# Patient Record
Sex: Male | Born: 1950
Health system: Southern US, Community
[De-identification: ages and names within clinical notes are randomized; demographics above are authoritative.]

## PROBLEM LIST (undated history)

## (undated) DIAGNOSIS — I4819 Other persistent atrial fibrillation: Secondary | ICD-10-CM

## (undated) DIAGNOSIS — E7800 Pure hypercholesterolemia, unspecified: Secondary | ICD-10-CM

## (undated) DIAGNOSIS — I5032 Chronic diastolic (congestive) heart failure: Secondary | ICD-10-CM

## (undated) DIAGNOSIS — E663 Overweight: Secondary | ICD-10-CM

## (undated) DIAGNOSIS — Z720 Tobacco use: Secondary | ICD-10-CM

## (undated) DIAGNOSIS — C4491 Basal cell carcinoma of skin, unspecified: Secondary | ICD-10-CM

## (undated) DIAGNOSIS — I10 Essential (primary) hypertension: Secondary | ICD-10-CM

## (undated) DIAGNOSIS — Z9989 Dependence on other enabling machines and devices: Secondary | ICD-10-CM

## (undated) DIAGNOSIS — G4733 Obstructive sleep apnea (adult) (pediatric): Secondary | ICD-10-CM

## (undated) DIAGNOSIS — F419 Anxiety disorder, unspecified: Secondary | ICD-10-CM

## (undated) DIAGNOSIS — N200 Calculus of kidney: Secondary | ICD-10-CM

## (undated) DIAGNOSIS — G4700 Insomnia, unspecified: Secondary | ICD-10-CM

## (undated) HISTORY — DX: Basal cell carcinoma of skin, unspecified: C44.91

## (undated) HISTORY — DX: Anxiety disorder, unspecified: F41.9

## (undated) HISTORY — DX: Insomnia, unspecified: G47.00

## (undated) HISTORY — DX: Calculus of kidney: N20.0

## (undated) HISTORY — PX: APPENDECTOMY: SHX54

## (undated) HISTORY — DX: Overweight: E66.3

## (undated) HISTORY — DX: Essential (primary) hypertension: I10

## (undated) HISTORY — PX: TONSILLECTOMY: SUR1361

## (undated) HISTORY — DX: Dependence on other enabling machines and devices: Z99.89

## (undated) HISTORY — DX: Tobacco use: Z72.0

## (undated) HISTORY — DX: Obstructive sleep apnea (adult) (pediatric): G47.33

## (undated) HISTORY — DX: Chronic diastolic (congestive) heart failure: I50.32

## (undated) HISTORY — DX: Pure hypercholesterolemia, unspecified: E78.00

## (undated) HISTORY — DX: Other persistent atrial fibrillation: I48.19

---

## 2003-07-27 ENCOUNTER — Ambulatory Visit (HOSPITAL_COMMUNITY): Admission: RE | Admit: 2003-07-27 | Discharge: 2003-07-27 | Payer: Self-pay | Admitting: Gastroenterology

## 2003-07-27 ENCOUNTER — Encounter (INDEPENDENT_AMBULATORY_CARE_PROVIDER_SITE_OTHER): Payer: Self-pay

## 2010-06-15 ENCOUNTER — Inpatient Hospital Stay (HOSPITAL_COMMUNITY): Admission: EM | Admit: 2010-06-15 | Discharge: 2010-06-22 | Payer: Self-pay | Admitting: Emergency Medicine

## 2010-06-15 ENCOUNTER — Ambulatory Visit: Payer: Self-pay | Admitting: Cardiology

## 2010-06-17 ENCOUNTER — Ambulatory Visit: Payer: Self-pay | Admitting: Cardiology

## 2010-06-17 ENCOUNTER — Encounter: Payer: Self-pay | Admitting: Cardiology

## 2010-06-21 ENCOUNTER — Encounter: Payer: Self-pay | Admitting: Internal Medicine

## 2010-06-24 ENCOUNTER — Ambulatory Visit: Payer: Self-pay | Admitting: Cardiology

## 2010-06-24 LAB — CONVERTED CEMR LAB: POC INR: 4.5

## 2010-06-30 ENCOUNTER — Ambulatory Visit: Payer: Self-pay | Admitting: Cardiovascular Disease

## 2010-07-07 ENCOUNTER — Ambulatory Visit: Payer: Self-pay | Admitting: Cardiology

## 2010-07-07 ENCOUNTER — Encounter: Payer: Self-pay | Admitting: Cardiology

## 2010-07-07 LAB — CONVERTED CEMR LAB: POC INR: 2.8

## 2010-07-11 ENCOUNTER — Ambulatory Visit: Payer: Self-pay | Admitting: Cardiology

## 2010-07-11 DIAGNOSIS — I4891 Unspecified atrial fibrillation: Secondary | ICD-10-CM | POA: Insufficient documentation

## 2010-07-11 DIAGNOSIS — I10 Essential (primary) hypertension: Secondary | ICD-10-CM | POA: Insufficient documentation

## 2010-07-14 ENCOUNTER — Ambulatory Visit: Payer: Self-pay | Admitting: Cardiology

## 2010-07-14 DIAGNOSIS — R0609 Other forms of dyspnea: Secondary | ICD-10-CM

## 2010-07-14 DIAGNOSIS — R0989 Other specified symptoms and signs involving the circulatory and respiratory systems: Secondary | ICD-10-CM

## 2010-07-19 ENCOUNTER — Telehealth: Payer: Self-pay | Admitting: Cardiology

## 2010-07-21 LAB — CONVERTED CEMR LAB
Bilirubin, Direct: 0.1 mg/dL (ref 0.0–0.3)
HDL: 47.8 mg/dL (ref >39.00)
Total Bilirubin: 0.6 mg/dL (ref 0.3–1.2)
VLDL: 37.6 mg/dL (ref 0.0–40.0)

## 2010-09-19 ENCOUNTER — Telehealth: Payer: Self-pay | Admitting: Cardiology

## 2010-10-05 ENCOUNTER — Encounter: Payer: Self-pay | Admitting: Cardiovascular Disease

## 2010-10-11 ENCOUNTER — Ambulatory Visit
Admission: RE | Admit: 2010-10-11 | Discharge: 2010-10-11 | Payer: Self-pay | Source: Home / Self Care | Attending: Cardiology | Admitting: Cardiology

## 2010-10-11 ENCOUNTER — Other Ambulatory Visit: Payer: Self-pay | Admitting: Cardiology

## 2010-10-11 DIAGNOSIS — E7800 Pure hypercholesterolemia, unspecified: Secondary | ICD-10-CM | POA: Insufficient documentation

## 2010-10-11 LAB — HEPATIC FUNCTION PANEL
ALT: 33 U/L (ref 0–53)
AST: 24 U/L (ref 0–37)
Alkaline Phosphatase: 67 U/L (ref 39–117)
Bilirubin, Direct: 0.1 mg/dL (ref 0.0–0.3)
Total Bilirubin: 0.7 mg/dL (ref 0.3–1.2)

## 2010-10-11 LAB — LIPID PANEL
LDL Cholesterol: 118 mg/dL — ABNORMAL HIGH (ref 0–99)
Total CHOL/HDL Ratio: 4

## 2010-10-11 NOTE — Medication Information (Signed)
Summary: new ot coumadin  Anticoagulant Therapy  Managed by: Reina Fuse, PharmD Referring MD: Shirlee Latch MD, Freida Busman PCP: Dr. Nehemiah Settle Supervising MD: Riley Kill MD, Maisie Fus Indication 1: Atrial Fibrillation Lab Used: [facility] [place] Site: [address] INR POC 4.5 INR RANGE 2-3  Dietary changes: no    Health status changes: no    Bleeding/hemorrhagic complications: no    Recent/future hospitalizations: no    Any changes in medication regimen? yes       Details: Toprol XL, ampicillin x 5 days  Recent/future dental: no  Any missed doses?: no       Is patient compliant with meds? yes      Comments: Pt had episode of rapid afib while in hospital for appendectomy. Started on Coumadin and Toprol XL (pt unsure of dose, but will bring in at visit next week).  Allergies (verified): No Known Drug Allergies  Anticoagulation Management History:      The patient comes in today for his initial visit for anticoagulation therapy.  Negative risk factors for bleeding include an age less than 59 years old.  The bleeding index is 'low risk'.  Negative CHADS2 values include Age > 59 years old.  Anticoagulation responsible provider: Riley Kill MD, Maisie Fus.  INR POC: 4.5.  Cuvette Lot#: [id number].    Anticoagulation Management Assessment/Plan:      The next INR is due 06/30/2010.  Results were reviewed/authorized by Reina Fuse, PharmD.  He was notified by Reina Fuse PharmD.         Current Anticoagulation Instructions: INR 4.5  Do not take Coumadin on Saturday, October 15th and Sunday, October 16th. Then, take Coumadin 5 mg (1 tab) daily. Return to clinic 1 week.

## 2010-10-11 NOTE — Assessment & Plan Note (Signed)
Summary: [place] [facility]   Primary Provider:  Dr. Nehemiah Settle   History of Present Illness: 60 yo with history of HTN and paroxysmal atrial fibrillation presents to establish cardiology followup.  Patient had a ruptured appendix in 10/11.  Post-appendectomy, he developed atrial fibrillation.  This was somewhat difficult to control and did not spontaneously convert, so TEE-guided cardioversion was done. During atrial fibrillation with RVR episode, patient had mild diastolic CHF requring Lasix.  He has remained in NSR on coumadin and Toprol XL.  He has noticed that his systolic BP has been running in the 140s (was better-controlled when he was on Diovan in the past).  He is quite active and has been walking briskly twice daily for exercise.  He used to bike but has cut back on this now that he is on coumadin.  He has cut himself a number of times working in his woodshop and has had some episodes of fairly profuse superficial bleeding.  He is still off work (Naval architect).  Currnetly, no exertional dyspnea or exertional chest pain. His work does require a lot of heavy lifting and loading/unloading and is likely puts him at higher risk of an accident causing bleeding from coumadin. Patient quit cigarettes after leaving the hospital.  He has cut back to 1-2 beers/week (prior 2-3 every other day).  He has OSA and has been using his CPAP as instructed.   ECG: NSR, Q in lead III  Labs (10/11): K 4.0, creatinine 1.03, HCT 42.7, BNP 325=>183 (in hospital)   Current Medications (verified): 1)  Toprol Xl 50 Mg Xr24h-Tab (Metoprolol Succinate) .... Take One Tablet By Mouth Once Daily. 2)  Coumadin 5 Mg Tabs (Warfarin Sodium) .... Use As Directed By Anticoagulation Clinic.  Allergies (verified): No Known Drug Allergies  Past History:  Past Medical History: 1. hypertension 2. acute appendicitis s/p appendectomy 10/11 (had rupture) 3. Atrial fibrillation: Post-op appendectdomy in 10/11, had TEE-guided DCCV 4.  Diastolic CHF in the setting of atrial fibrillation with RVR.  Echo (10/11): EF 60-65%, mild LVH, mild to moderate LAE.  5. OSA on CPAP  Family History: Mother with MI in her 93s, father with PCI at 34, father with AAA  Social History: The patient is married.   He is employed as a Naval architect.   He has an occasional cigar here and there.  He quit smoking cigarettes in 10/11 (1 pack/week prior) 2-3 beers/week now, used to drink 2-3 beers every other day.   Review of Systems       All systems reviewed and negative except as per HPI.   Vital Signs:  Patient profile:   60 year old male Height:      75 inches Weight:      236 pounds BMI:     29.60 Pulse rate:   85 / minute Resp:     16 per minute BP sitting:   144 / 90  (left arm)  Vitals Entered By: Marrion Coy, CNA (July 11, 2010 11:59 AM)  Physical Exam  General:  Well developed, well nourished, in no acute distress. Head:  normocephalic and atraumatic Nose:  no deformity, discharge, inflammation, or lesions Mouth:  Teeth, gums and palate normal. Oral mucosa normal. Neck:  Neck supple, no JVD. No masses, thyromegaly or abnormal cervical nodes. Lungs:  Clear bilaterally to auscultation and percussion. Heart:  Non-displaced PMI, chest non-tender; regular rate and rhythm, S1, S2 without murmurs, rubs or gallops. Carotid upstroke normal, no bruit.  Pedals normal pulses. No edema,  no varicosities. Abdomen:  Bowel sounds positive; abdomen soft and non-tender without masses, organomegaly, or hernias noted. No hepatosplenomegaly. Msk:  Back normal, normal gait. Muscle strength and tone normal. Extremities:  No clubbing or cyanosis. Neurologic:  Alert and oriented x 3. Skin:  Intact without lesions or rashes. Psych:  Normal affect.   Impression & Recommendations:  Problem # 1:  ATRIAL FIBRILLATION (ICD-427.31) Paroxysmal atrial fibrillation post-op appendectomy.  This required TEE-guided cardioversion.  Patient remains in  NSR.  CHADSVASC and CHADS2 score are both 1 for HTN (would not count diastolic CHF as he had CHF only when in atrial fibrillation with RVR after receiving IV fluids for surgery).  He is at high risk for bleeding from his occupation and hobbies.  I will have him continue coumadin for 6 weeks post cardioversion, then he will stop coumadin and start ASA 325 mg daily.  I am going to decrease Toprol XL to 25 mg daily as I am starting him back on Diovan, and he feels the Toprol XL has made him fatigued.  He may go back to work 07/25/10 (I will send a copy of this note to [facility].   Problem # 2:  HYPERTENSION, UNSPECIFIED (ICD-401.9) BP running high.  He was not sent home from the hospital on his Diovan.  I will have him start back on this at 160 mg daily, which was his prior dose.  He needs to continue his CPAP.   I will check lipids/LFTs.   Patient Instructions: 1)  Continue Coumadin until 08/02/10--start Aspirin 325mg  daily on 08/03/10. Aspirin should be buffered or coated. 2)  Take Diovan 160mg  daily. 3)  Decrease Toprol XL to 25mg  daily--this will be one-half of a 50mg  tablet daily. 4)  Your physician recommends that you return for a FASTING lipid profile/liver profile-786.09  401.9 -you can have this on Thursday when you have your Coumadin checked. 5)  Your physician recommends that you schedule a follow-up appointment in: 3 months with Dr Shirlee Latch.  Appended Document: [place] [facility] faxed Department of Transportation Medical Examination Request for Additional Information to [facility]  [phone]

## 2010-10-11 NOTE — Medication Information (Signed)
Summary: Coumadin Clinic  Anticoagulant Therapy  Managed by: Reina Fuse, PharmD Referring MD: Shirlee Latch MD, Freida Busman PCP: Dr. Rebeca Allegra MD: Jens Som MD, Arlys John Indication 1: Atrial Fibrillation Lab Used: [facility] [place] Site: [address] INR POC 2.8 INR RANGE 2-3  Dietary changes: no    Health status changes: no    Bleeding/hemorrhagic complications: no    Recent/future hospitalizations: no    Any changes in medication regimen? no    Recent/future dental: no  Any missed doses?: no       Is patient compliant with meds? yes       Allergies: No Known Drug Allergies  Anticoagulation Management History:      The patient is taking warfarin and comes in today for a routine follow up visit.  Negative risk factors for bleeding include an age less than 59 years old.  The bleeding index is 'low risk'.  Negative CHADS2 values include Age > 59 years old.  Anticoagulation responsible Betha Shadix: Jens Som MD, Arlys John.  INR POC: 2.8.  Cuvette Lot#: [id number].    Anticoagulation Management Assessment/Plan:      The patient's current anticoagulation dose is Coumadin 5 mg tabs: Use as directed by anticoagulation clinic..  The next INR is due 07/14/2010.  Results were reviewed/authorized by Reina Fuse, PharmD.  He was notified by Reina Fuse PharmD.         Prior Anticoagulation Instructions: INR 3.0  Take Coumadin 5 mg (1 tab) on Sun, Tues, Wed, Thur, Sat and Coumadin 2.5 mg (0.5 tab) on Mondays and Fridays. Return to clinic in 1 week.   Current Anticoagulation Instructions: INR 2.8  Continue taking Coumadin 1 tab (5 mg) on all days except Coumadin 0.5 tab (2.5 mg) on Mondays and Fridays.  Return to clinic 1 week.

## 2010-10-11 NOTE — Medication Information (Signed)
Summary: rov/sl  Anticoagulant Therapy  Managed by: Reina Fuse, PharmD Referring MD: Shirlee Latch MD, Freida Busman PCP: Dr. Rebeca Allegra MD: Eden Emms MD, Theron Arista Indication 1: Atrial Fibrillation Lab Used: [facility] [place] Site: [address] INR POC 3.0 INR RANGE 2-3  Dietary changes: no    Health status changes: no    Bleeding/hemorrhagic complications: no    Recent/future hospitalizations: no    Any changes in medication regimen? no    Recent/future dental: no  Any missed doses?: no       Is patient compliant with meds? yes       Allergies (verified): No Known Drug Allergies  Anticoagulation Management History:      The patient is taking warfarin and comes in today for a routine follow up visit.  Negative risk factors for bleeding include an age less than 98 years old.  The bleeding index is 'low risk'.  Negative CHADS2 values include Age > 14 years old.  Anticoagulation responsible Gedalia Mcmillon: Eden Emms MD, Theron Arista.  INR POC: 3.0.  Cuvette Lot#: [id number].    Anticoagulation Management Assessment/Plan:      The next INR is due 07/07/2010.  Results were reviewed/authorized by Reina Fuse, PharmD.  He was notified by Reina Fuse PharmD.         Prior Anticoagulation Instructions: INR 4.5  Do not take Coumadin on Saturday, October 15th and Sunday, October 16th. Then, take Coumadin 5 mg (1 tab) daily. Return to clinic 1 week.   Current Anticoagulation Instructions: INR 3.0  Take Coumadin 5 mg (1 tab) on Sun, Tues, Wed, Thur, Sat and Coumadin 2.5 mg (0.5 tab) on Mondays and Fridays. Return to clinic in 1 week.

## 2010-10-11 NOTE — Progress Notes (Signed)
Summary: Test results [facility]- LVM***pt rtn call***  Phone Note Call from Patient Call back at [phone]   Caller: Patient Reason for Call: Lab or Test Results Initial call taken by: Judie Grieve,  July 19, 2010 10:13 AM  Follow-up for Phone Call        left message to call back Dossie Arbour, RN, BSN  July 19, 2010 10:25 AM  Pt returning call for test results Judie Grieve  July 19, 2010 10:45 AM left message to call back Dossie Arbour, RN, BSN  July 19, 2010 10:55 AM11/9/11  07/20/10 0816 [email or web address] Claris Gladden, RN, BSN  LVMTCB. Whitney Maeola Sarah RN  July 20, 2010 11:00 AM  pt. aware. I will send his prescription in. We went over dietary changes as well. I have advised him to not eat/drink on his f/u with Dr. Shirlee Latch on 10/11/10 so they could recheck his lipids and liver function. Whitney Maeola Sarah RN  July 20, 2010 11:48 AM   Follow-up by: Whitney Maeola Sarah RN,  July 20, 2010 11:00 AM  Additional Follow-up for Phone Call Additional follow up Details #1::        pt rtn your call he uses [facility] Omer Jack  July 20, 2010 11:46 AM     New/Updated Medications: SIMVASTATIN 40 MG TABS (SIMVASTATIN) Take one tablet by mouth daily at bedtime Prescriptions: SIMVASTATIN 40 MG TABS (SIMVASTATIN) Take one tablet by mouth daily at bedtime  #30 x 6   Entered by:   Ellender Hose RN   Authorized by:   Marca Ancona, MD   Signed by:   Ellender Hose RN on 07/20/2010   Method used:   Electronically to        [facility]* (retail)       [address]       [place], [place]  [id number]       Ph: [phone]       Fax: [phone]   RxID:   [id number]   Appended Document: Test results [facility])- LVM***pt rtn call*** pt given results--see phone note 07/20/10

## 2010-10-11 NOTE — Medication Information (Signed)
Summary: rov/sl  Anticoagulant Therapy  Managed by: Reina Fuse, PharmD Referring MD: Shirlee Latch MD, Freida Busman PCP: Dr. Nehemiah Settle Supervising MD: Patty Sermons Indication 1: Atrial Fibrillation Lab Used: [facility] [place] Site: [address] INR POC 2.4 INR RANGE 2-3  Dietary changes: no    Health status changes: no    Bleeding/hemorrhagic complications: no    Recent/future hospitalizations: no    Any changes in medication regimen? yes       Details: Diovan started and metoprolol dose was halved  Recent/future dental: no  Any missed doses?: no       Is patient compliant with meds? yes       Allergies: No Known Drug Allergies  Anticoagulation Management History:      The patient is taking warfarin and comes in today for a routine follow up visit.  Negative risk factors for bleeding include an age less than 62 years old.  The bleeding index is 'low risk'.  Positive CHADS2 values include History of HTN.  Negative CHADS2 values include Age > 33 years old.  Anticoagulation responsible provider: [place].  INR POC: 2.4.  Cuvette Lot#: [id number].  Exp: 08/2011.    Anticoagulation Management Assessment/Plan:      The patient's current anticoagulation dose is Coumadin 5 mg tabs: Use as directed by anticoagulation clinic..  The next INR is due 07/14/2010.  Results were reviewed/authorized by Reina Fuse, PharmD.  He was notified by Hoy Register, PharmD Candidate.         Prior Anticoagulation Instructions: INR 2.8  Continue taking Coumadin 1 tab (5 mg) on all days except Coumadin 0.5 tab (2.5 mg) on Mondays and Fridays.  Return to clinic 1 week.   Current Anticoagulation Instructions: INR 2.4  Continue dose as before of 1 tablet daily except 1/2 tablet on Monday and Friday.  Last dose of Coumadin is 11/22 then start Aspirin per doctor's instructions on 11/23. Prescriptions: COUMADIN 5 MG TABS (WARFARIN SODIUM) Use as directed by anticoagulation clinic.  #15 x 0   Entered  by:   Weston Brass PharmD   Authorized by:   Marca Ancona, MD   Signed by:   Weston Brass PharmD on 07/14/2010   Method used:   Electronically to        [facility]* (retail)       [address]       [place], [place]  [id number]       Ph: [phone]       Fax: [phone]   RxID:   [id number]

## 2010-10-12 ENCOUNTER — Encounter: Payer: Self-pay | Admitting: Cardiology

## 2010-10-13 NOTE — Progress Notes (Signed)
Summary: SIMVASTATIN, TOPROL  Phone Note Refill Request Message from:  Patient on September 19, 2010 9:12 AM  Refills Requested: Medication #1:  SIMVASTATIN 40 MG TABS Take one tablet by mouth daily at bedtime.  Medication #2:  TOPROL XL 50 MG XR24H-TAB Take one-half  tablet by mouth once daily. [facility] [phone]  Initial call taken by: Judie Grieve,  September 19, 2010 9:13 AM    Prescriptions: SIMVASTATIN 40 MG TABS (SIMVASTATIN) Take one tablet by mouth daily at bedtime  #90 x 3   Entered by:   Caralee Ates CMA   Authorized by:   Marca Ancona, MD   Signed by:   Caralee Ates CMA on 09/19/2010   Method used:   Faxed to ...       [facility] (mail-order)             , [place]         Ph:        Fax: [phone]   RxID:   [id number] TOPROL XL 50 MG XR24H-TAB (METOPROLOL SUCCINATE) Take one-half  tablet by mouth once daily.  #45 x 3   Entered by:   Caralee Ates CMA   Authorized by:   Marca Ancona, MD   Signed by:   Caralee Ates CMA on 09/19/2010   Method used:   Faxed to ...       [facility] (mail-order)             , [place]         Ph:        Fax: [phone]   RxID:   [id number]

## 2010-10-13 NOTE — Medication Information (Signed)
Summary: Coumadin Clinic  Anticoagulant Therapy  Managed by: Inactive Referring MD: Shirlee Latch MD, Freida Busman PCP: Dr. Rebeca Allegra MD: Patty Sermons Indication 1: Atrial Fibrillation Lab Used: [facility] [place] Site: [address] INR RANGE 2-3          Comments: Changed to ASA 325 per Dr. Shirlee Latch  Allergies: No Known Drug Allergies  Anticoagulation Management History:      Negative risk factors for bleeding include an age less than 59 years old.  The bleeding index is 'low risk'.  Positive CHADS2 values include History of HTN.  Negative CHADS2 values include Age > 59 years old.  Anticoagulation responsible provider: [place].  Exp: 08/2011.    Anticoagulation Management Assessment/Plan:      The patient's current anticoagulation dose is Coumadin 5 mg tabs: Use as directed by anticoagulation clinic..  The next INR is due 07/14/2010.  Results were reviewed/authorized by Inactive.         Prior Anticoagulation Instructions: INR 2.4  Continue dose as before of 1 tablet daily except 1/2 tablet on Monday and Friday.  Last dose of Coumadin is 11/22 then start Aspirin per doctor's instructions on 11/23.

## 2010-10-19 NOTE — Assessment & Plan Note (Signed)
Summary: [facility]   Primary Provider:  Dr. Nehemiah Settle   History of Present Illness: 60 yo with history of HTN and paroxysmal atrial fibrillation presents for followup.  Patient had a ruptured appendix in 10/11.  Post-appendectomy, he developed atrial fibrillation.  This was somewhat difficult to control and did not spontaneously convert, so TEE-guided cardioversion was done. He was on coumadin for about 6 wks after cardioversion.  We then stopped it and put him on ASA as his CHADSVASC score was only 1.  Patient has been doing well recently.  No tachypalpitations.  He is in sinus rhythm today.  No chest pain or exertional dyspnea.  He walks 1.5 miles about [place] times a week for exercise with no problems.  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   LDL was quite high in 11/11, so he is now taking simvastatin.   Labs (10/11): K 4.0, creatinine 1.03, HCT 42.7, BNP 325=>183 (in hospital) Labs (11/11): LDL 191, HDL 48  CHF History:      Other comments include: no complaints.     Current Medications (verified): 1)  Toprol Xl 50 Mg Xr24h-Tab (Metoprolol Succinate) .... Take One-Half  Tablet By Mouth Once Daily. 2)  Simvastatin 40 Mg Tabs (Simvastatin) .... Take One Tablet By Mouth Daily At Bedtime [place])  Ecotrin 325 Mg Tbec (Aspirin) 4)  Losartan Potassium 50 Mg Tabs (Losartan Potassium) .... Once Daily 5)  Multivitamins  Tabs (Multiple Vitamin) .... Daily 6)  Co-Enzyme Q-10 100 Mg Caps (Coenzyme Q10) .... Daily  Allergies (verified): No Known Drug Allergies  Past History:  Past Medical History: 1. hypertension 2. acute appendicitis s/p appendectomy 10/11 (had rupture) [place]. Atrial fibrillation: Post-op appendectdomy in 10/11, had TEE-guided DCCV 4. Diastolic CHF in the setting of atrial fibrillation with RVR.  Echo (10/11): EF 60-65%, mild LVH, mild to moderate LAE.  5. OSA on CPAP 6. Hyperlipidemia  Family History: Reviewed history from 07/11/2010 and no changes required. Mother  with MI in her 62s, father with PCI at 4, father with AAA  Social History: Reviewed history from 07/11/2010 and no changes required. The patient is married.   He is employed as a Naval architect.   He has an occasional cigar here and there.  He quit smoking cigarettes in 10/11 (1 pack/week prior) 2-[place] beers/week now, used to drink 2-[place] beers every other day.   Vital Signs:  Patient profile:   60 year old male Height:      75 inches Weight:      239 pounds Pulse rate:   80 / minute Pulse rhythm:   regular BP sitting:   116 / 82  (left arm)  Physical Exam  General:  Well developed, well nourished, in no acute distress. Neck:  Neck supple, no JVD. No masses, thyromegaly or abnormal cervical nodes. Lungs:  Clear bilaterally to auscultation and percussion. Heart:  Non-displaced PMI, chest non-tender; regular rate and rhythm, S1, S2 without murmurs, rubs or gallops. Carotid upstroke normal, no bruit.  Pedals normal pulses. No edema, no varicosities. Abdomen:  Bowel sounds positive; abdomen soft and non-tender without masses, organomegaly, or hernias noted. No hepatosplenomegaly. Extremities:  No clubbing or cyanosis. Neurologic:  Alert and oriented x [place]. Psych:  Normal affect.   Impression & Recommendations:  Problem # 1:  ATRIAL FIBRILLATION (ICD-427.31) No further symptomatic atrial fibrillation.  He is in NSR today.  Suspect atrial fib was triggered by the stress of surgery (though he also has history of HTN and OSA).  CHADSVASC score = 1.  Continue Toprol XL and ASA 325.  If has recurrence, could consider dronedarone use.   Problem # 2:  HYPERCHOLESTEROLEMIA (ICD-272.0) Check lipids/LFTs today on simvastatin.   Problem # [place]:  HYPERTENSION, UNSPECIFIED (ICD-401.9) BP under good control on current regimen.   Other Orders: TLB-Hepatic/Liver Function Pnl (80076-HEPATIC) [facility]-Lipid Panel (80061-LIPID)  CHF Assessment/Plan:      The patient's current weight is 239 pounds.  His previous  weight was 236 pounds.    Patient Instructions: 1)  Your physician recommends that you schedule a follow-up appointment in: 6 months with Dr Shirlee Latch 2)  Your physician recommends that you return for a FASTING lipid and liver profile: today [place])  Your physician recommends that you continue on your current medications as directed. Please refer to the Current Medication list given to you today.    Current Allergies (reviewed today): No known allergies

## 2010-10-19 NOTE — Letter (Signed)
Summary: Custom - Lipid  [facility], Main Office  [address]   [place], [place] [address]   Phone: [phone]  Fax: [phone]     October 12, 2010 MRN: [id number]   [place] [address] [place], [place]  [address]   Dear [name],  Dr Shirlee Latch has  reviewed your cholesterol results.  They are as follows:     Total Cholesterol:    185 (Desirable: less than 200)       HDL  Cholesterol:     50.30  (Desirable: greater than 40 for men and 50 for women)       LDL Cholesterol:       118  (Desirable: less than 100 for low risk and less than 70 for moderate to high risk)       Triglycerides:       82.0  (Desirable: less than 150)  His  recommendations include: more exercise to help achieve an LDL cholesterol under 100.   Call our office at the number listed above if you have any questions.  Lowering your LDL cholesterol is important, but it is only one of a large number of "risk factors" that may indicate that you are at risk for heart disease, stroke or other complications of hardening of the arteries.  Other risk factors include:   A.  Cigarette Smoking* B.  High Blood Pressure* C.  Obesity* D.   Low HDL Cholesterol (see yours above)* E.   Diabetes Mellitus (higher risk if your is uncontrolled) F.  Family history of premature heart disease G.  Previous history of stroke or cardiovascular disease    *These are risk factors YOU HAVE CONTROL OVER.  For more information, visit .  There is now evidence that lowering the TOTAL CHOLESTEROL AND LDL CHOLESTEROL can reduce the risk of heart disease.  The American Heart Association recommends the following guidelines for the treatment of elevated cholesterol:  1.  If there is now current heart disease and less than two risk factors, TOTAL CHOLESTEROL should be less than 200 and LDL CHOLESTEROL should be less than 100. 2.  If there is current heart disease or two or more risk factors, TOTAL CHOLESTEROL should  be less than 200 and LDL CHOLESTEROL should be less than 70.  A diet low in cholesterol, saturated fat, and calories is the cornerstone of treatment for elevated cholesterol.  Cessation of smoking and exercise are also important in the management of elevated cholesterol and preventing vascular disease.  Studies have shown that 30 to 60 minutes of physical activity most days can help lower blood pressure, lower cholesterol, and keep your weight at a healthy level.  Drug therapy is used when cholesterol levels do not respond to therapeutic lifestyle changes (smoking cessation, diet, and exercise) and remains unacceptably high.  If medication is started, it is important to have you levels checked periodically to evaluate the need for further treatment options.  Thank you,    Luana Shu

## 2010-11-24 LAB — CBC
HCT: 40.6 % (ref 39.0–52.0)
HCT: 41.4 % (ref 39.0–52.0)
HCT: 41.4 % (ref 39.0–52.0)
HCT: 42.7 % (ref 39.0–52.0)
HCT: 43.5 % (ref 39.0–52.0)
HCT: 43.6 % (ref 39.0–52.0)
Hemoglobin: 14.6 g/dL (ref 13.0–17.0)
Hemoglobin: 14.7 g/dL (ref 13.0–17.0)
Hemoglobin: 17.1 g/dL — ABNORMAL HIGH (ref 13.0–17.0)
MCH: 30.8 pg (ref 26.0–34.0)
MCH: 31.2 pg (ref 26.0–34.0)
MCH: 31.4 pg (ref 26.0–34.0)
MCHC: 34.5 g/dL (ref 30.0–36.0)
MCV: 88.2 fL (ref 78.0–100.0)
MCV: 89.8 fL (ref 78.0–100.0)
MCV: 91.2 fL (ref 78.0–100.0)
Platelets: 124 10*3/uL — ABNORMAL LOW (ref 150–400)
Platelets: 125 10*3/uL — ABNORMAL LOW (ref 150–400)
RBC: 4.61 MIL/uL (ref 4.22–5.81)
RBC: 4.71 MIL/uL (ref 4.22–5.81)
RBC: 4.93 MIL/uL (ref 4.22–5.81)
RBC: 5.26 MIL/uL (ref 4.22–5.81)
RDW: 13 % (ref 11.5–15.5)
RDW: 13.2 % (ref 11.5–15.5)
RDW: 13.2 % (ref 11.5–15.5)
RDW: 13.3 % (ref 11.5–15.5)
RDW: 13.4 % (ref 11.5–15.5)
WBC: 14.2 10*3/uL — ABNORMAL HIGH (ref 4.0–10.5)
WBC: 7.2 10*3/uL (ref 4.0–10.5)
WBC: 8.4 10*3/uL (ref 4.0–10.5)
WBC: 9.2 10*3/uL (ref 4.0–10.5)
WBC: 9.3 10*3/uL (ref 4.0–10.5)
WBC: 9.9 10*3/uL (ref 4.0–10.5)

## 2010-11-24 LAB — URINALYSIS, ROUTINE W REFLEX MICROSCOPIC
Bilirubin Urine: NEGATIVE
Glucose, UA: NEGATIVE mg/dL
Specific Gravity, Urine: 1.017 (ref 1.005–1.030)
Urobilinogen, UA: 0.2 mg/dL (ref 0.0–1.0)

## 2010-11-24 LAB — COMPREHENSIVE METABOLIC PANEL
ALT: 21 U/L (ref 0–53)
AST: 23 U/L (ref 0–37)
Albumin: 3.5 g/dL (ref 3.5–5.2)
CO2: 21 mEq/L (ref 19–32)
Calcium: 8.6 mg/dL (ref 8.4–10.5)
Chloride: 106 mEq/L (ref 96–112)
GFR calc Af Amer: >60 mL/min (ref >60)
GFR calc non Af Amer: >60 mL/min (ref >60)
Sodium: 136 mEq/L (ref 135–145)
Total Bilirubin: 1.8 mg/dL — ABNORMAL HIGH (ref 0.3–1.2)

## 2010-11-24 LAB — MAGNESIUM: Magnesium: 2.1 mg/dL (ref 1.5–2.5)

## 2010-11-24 LAB — BASIC METABOLIC PANEL
BUN: 11 mg/dL (ref 6–23)
BUN: 12 mg/dL (ref 6–23)
BUN: 8 mg/dL (ref 6–23)
CO2: 27 mEq/L (ref 19–32)
CO2: 30 mEq/L (ref 19–32)
CO2: 31 mEq/L (ref 19–32)
Calcium: 8.2 mg/dL — ABNORMAL LOW (ref 8.4–10.5)
Chloride: 106 mEq/L (ref 96–112)
Chloride: 107 mEq/L (ref 96–112)
Chloride: 95 mEq/L — ABNORMAL LOW (ref 96–112)
Chloride: 99 mEq/L (ref 96–112)
Creatinine, Ser: 1.05 mg/dL (ref 0.4–1.5)
Creatinine, Ser: 1.25 mg/dL (ref 0.4–1.5)
GFR calc Af Amer: >60 mL/min (ref >60)
GFR calc Af Amer: >60 mL/min (ref >60)
GFR calc non Af Amer: >60 mL/min (ref >60)
GFR calc non Af Amer: >60 mL/min (ref >60)
GFR calc non Af Amer: >60 mL/min (ref >60)
Glucose, Bld: 111 mg/dL — ABNORMAL HIGH (ref 70–99)
Potassium: 3.5 mEq/L (ref 3.5–5.1)
Potassium: 3.6 mEq/L (ref 3.5–5.1)
Potassium: 4 mEq/L (ref 3.5–5.1)
Potassium: 4 mEq/L (ref 3.5–5.1)
Potassium: 4.2 mEq/L (ref 3.5–5.1)
Potassium: 4.5 mEq/L (ref 3.5–5.1)
Sodium: 132 mEq/L — ABNORMAL LOW (ref 135–145)
Sodium: 139 mEq/L (ref 135–145)
Sodium: 139 mEq/L (ref 135–145)

## 2010-11-24 LAB — LIPASE, BLOOD: Lipase: 26 U/L (ref 11–59)

## 2010-11-24 LAB — PROTIME-INR
INR: 1.03 (ref 0.00–1.49)
INR: 1.36 (ref 0.00–1.49)
INR: 2.01 — ABNORMAL HIGH (ref 0.00–1.49)
Prothrombin Time: 17 seconds — ABNORMAL HIGH (ref 11.6–15.2)
Prothrombin Time: 22.9 seconds — ABNORMAL HIGH (ref 11.6–15.2)

## 2010-11-24 LAB — URINE MICROSCOPIC-ADD ON

## 2010-11-24 LAB — DIFFERENTIAL
Eosinophils Absolute: 0 10*3/uL (ref 0.0–0.7)
Eosinophils Relative: 0 % (ref 0–5)
Lymphs Abs: 2.6 10*3/uL (ref 0.7–4.0)
Monocytes Absolute: 0.1 10*3/uL (ref 0.1–1.0)

## 2010-11-24 LAB — BRAIN NATRIURETIC PEPTIDE: Pro B Natriuretic peptide (BNP): 183 pg/mL — ABNORMAL HIGH (ref 0.0–100.0)

## 2010-11-24 LAB — CARDIAC PANEL(CRET KIN+CKTOT+MB+TROPI)
CK, MB: 1.3 ng/mL (ref 0.3–4.0)
Relative Index: 0.8 (ref 0.0–2.5)
Troponin I: 0.05 ng/mL (ref 0.00–0.06)

## 2010-11-24 LAB — MRSA PCR SCREENING: MRSA by PCR: NEGATIVE

## 2010-11-24 LAB — HEPARIN LEVEL (UNFRACTIONATED): Heparin Unfractionated: 0.18 IU/mL — ABNORMAL LOW (ref 0.30–0.70)

## 2011-01-27 NOTE — Op Note (Signed)
   NAME:  [place] Park, [place] Park                           ACCOUNT NO.:  [id number]   MEDICAL RECORD NO.:  [id number]                   PATIENT TYPE:  AMB   LOCATION:  ENDO                                 FACILITY:  [facility]   PHYSICIAN:  Danise Edge, M.D.                DATE OF BIRTH:  01/24/1951   DATE OF PROCEDURE:  07/27/2003  DATE OF DISCHARGE:                                 OPERATIVE REPORT   PROCEDURE:  Screening colonoscopy.   INDICATIONS FOR PROCEDURE:  Mr. [name] is a 60 year old male born  1951/06/22.  Mr. [name] is scheduled to undergo his first screening  colonoscopy with polypectomy to prevent colon cancer.   ENDOSCOPIST:  Charolett Bumpers, M.D.   PREMEDICATION:  Versed 10 mg, Demerol 50 mg .   DESCRIPTION OF PROCEDURE:  After obtaining informed consent, Mr. [name] was  placed in the left lateral decubitus position. I administered intravenous  Demerol and intravenous Versed to achieve conscious sedation for the  procedure. The patient's blood pressure, oxygen saturation and cardiac  rhythm were monitored throughout the procedure and documented in the medical  record.   Anal inspection was normal. Digital rectal exam was normal.  The prostate  was nonnodular. The Olympus adjustable pediatric colonoscope was introduced  into the rectum and easily advanced to the cecum. Colonic preparation for  the exam today was excellent.   RECTUM:  Normal.   SIGMOID COLON AND DESCENDING COLON:  Normal.   SPLENIC FLEXURE:  Normal.   TRANSVERSE COLON:  Normal.   HEPATIC FLEXURE:  Normal.   ASCENDING COLON:  From the proximal ascending colon, a 1 mm sessile polyp  was removed with the cold biopsy forceps.   CECUM AND ILEOCECAL VALVE:  Normal.   ASSESSMENT:  A diminutive polyp was removed from the proximal ascending  colon with the cold biopsy forceps;  otherwise normal screening  proctocolonoscopy to the cecum.    RECOMMENDATIONS:  If proximal ascending colon polyp  returns neoplastic  pathologically, Mr. [name] should undergo a repeat colonoscopy in five years.                                               Danise Edge, M.D.    MJ/MEDQ  D:  07/27/2003  T:  07/27/2003  Job:  [id number]

## 2011-05-12 ENCOUNTER — Other Ambulatory Visit: Payer: Self-pay | Admitting: Dermatology

## 2011-06-27 ENCOUNTER — Telehealth: Payer: Self-pay | Admitting: Cardiology

## 2011-06-27 ENCOUNTER — Other Ambulatory Visit: Payer: Self-pay

## 2011-06-27 MED ORDER — SIMVASTATIN 40 MG PO TABS: 40.0000 mg | ORAL_TABLET | Freq: Every evening | ORAL | Status: DC

## 2011-06-27 MED ORDER — METOPROLOL SUCCINATE ER 50 MG PO TB24: 25.0000 mg | ORAL_TABLET | Freq: Every day | ORAL | Status: DC

## 2011-06-27 NOTE — Telephone Encounter (Signed)
Walk In Pt Form" Pt needs Prescriptions Faxed in" sent to [facility] 06/27/11/km

## 2011-07-06 ENCOUNTER — Other Ambulatory Visit: Payer: Self-pay | Admitting: Dermatology

## 2011-12-30 IMAGING — CR DG CHEST 1V PORT
1 series · 1 of 1 positions shown · non-contrast
Comparison: CT abdomen 06/15/2010.

CLINICAL DATA: 59-year-old male with appendicitis.

PORTABLE CHEST - 1 VIEW

[view not recorded]
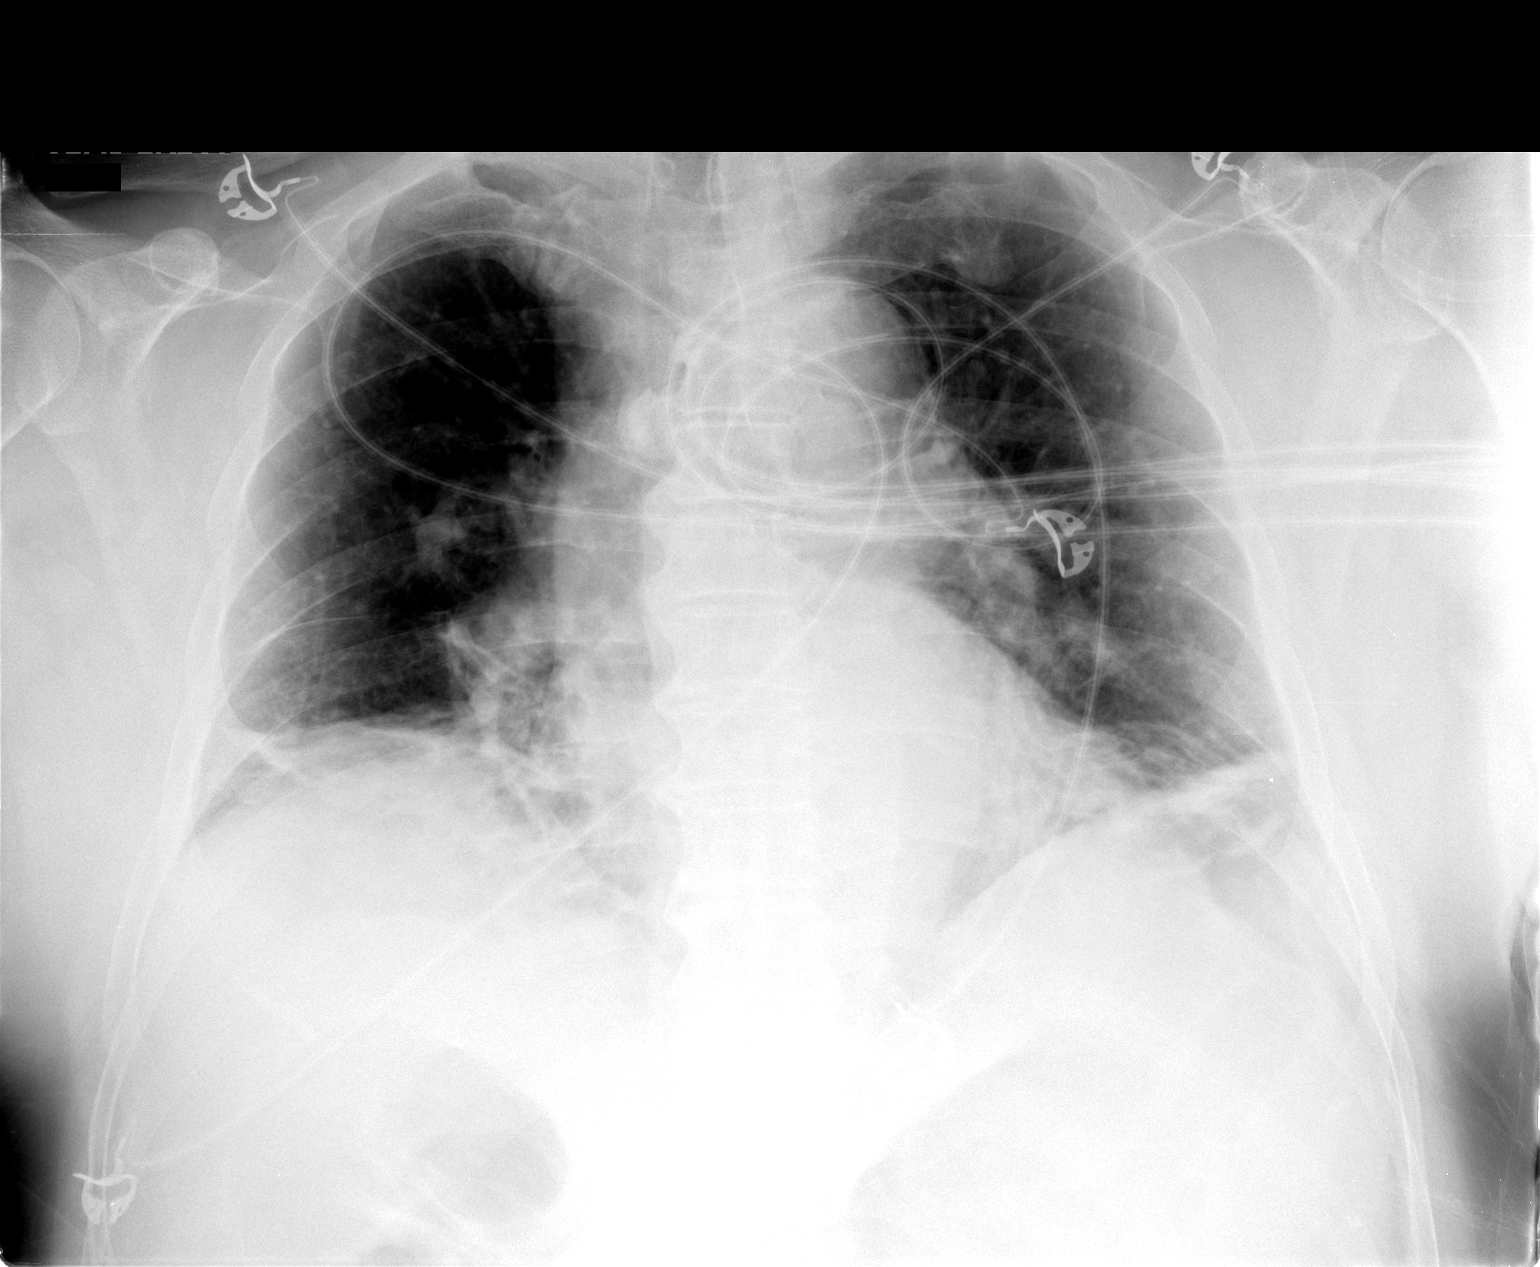

[1 of 1 positions shown; findings below may reference images not displayed]

FINDINGS: Portable semi upright AP view 8765 hours.  Low lung
volumes.  Confluent curvilinear opacity at both lung bases.
Cardiac size and mediastinal contours are within normal limits.  No
pneumothorax or pneumoperitoneum.  No definite effusion.  No
pulmonary edema.
IMPRESSION: Low lung volumes with confluent bibasilar atelectasis.

## 2014-02-10 ENCOUNTER — Other Ambulatory Visit: Payer: Self-pay

## 2014-04-28 ENCOUNTER — Ambulatory Visit
Admission: RE | Admit: 2014-04-28 | Discharge: 2014-04-28 | Disposition: A | Discharge status: Home / Self Care | Payer: 59 | Source: Ambulatory Visit | Attending: Internal Medicine | Admitting: Internal Medicine

## 2014-04-28 ENCOUNTER — Other Ambulatory Visit: Payer: Self-pay | Admitting: Internal Medicine

## 2014-04-28 DIAGNOSIS — M79605 Pain in left leg: Secondary | ICD-10-CM

## 2015-06-30 DIAGNOSIS — R7301 Impaired fasting glucose: Secondary | ICD-10-CM | POA: Insufficient documentation

## 2015-07-01 ENCOUNTER — Ambulatory Visit (INDEPENDENT_AMBULATORY_CARE_PROVIDER_SITE_OTHER): Payer: 59 | Admitting: Cardiovascular Disease

## 2015-07-01 ENCOUNTER — Encounter: Payer: Self-pay | Admitting: Cardiovascular Disease

## 2015-07-01 VITALS — BP 120/80 | HR 126 | Ht 75.0 in | Wt 261.4 lb

## 2015-07-01 DIAGNOSIS — I4891 Unspecified atrial fibrillation: Secondary | ICD-10-CM

## 2015-07-01 DIAGNOSIS — G4733 Obstructive sleep apnea (adult) (pediatric): Secondary | ICD-10-CM | POA: Insufficient documentation

## 2015-07-01 DIAGNOSIS — C4491 Basal cell carcinoma of skin, unspecified: Secondary | ICD-10-CM | POA: Insufficient documentation

## 2015-07-01 DIAGNOSIS — I10 Essential (primary) hypertension: Secondary | ICD-10-CM | POA: Insufficient documentation

## 2015-07-01 DIAGNOSIS — G4700 Insomnia, unspecified: Secondary | ICD-10-CM | POA: Insufficient documentation

## 2015-07-01 DIAGNOSIS — I5032 Chronic diastolic (congestive) heart failure: Secondary | ICD-10-CM | POA: Insufficient documentation

## 2015-07-01 DIAGNOSIS — E663 Overweight: Secondary | ICD-10-CM | POA: Insufficient documentation

## 2015-07-01 DIAGNOSIS — N200 Calculus of kidney: Secondary | ICD-10-CM | POA: Insufficient documentation

## 2015-07-01 DIAGNOSIS — Z9989 Dependence on other enabling machines and devices: Secondary | ICD-10-CM

## 2015-07-01 DIAGNOSIS — F419 Anxiety disorder, unspecified: Secondary | ICD-10-CM | POA: Insufficient documentation

## 2015-07-01 DIAGNOSIS — E7800 Pure hypercholesterolemia, unspecified: Secondary | ICD-10-CM | POA: Insufficient documentation

## 2015-07-01 DIAGNOSIS — Z720 Tobacco use: Secondary | ICD-10-CM | POA: Insufficient documentation

## 2015-07-01 MED ORDER — METOPROLOL SUCCINATE ER 50 MG PO TB24: 50.0000 mg | ORAL_TABLET | Freq: Two times a day (BID) | ORAL | Status: DC

## 2015-07-01 NOTE — Progress Notes (Signed)
Chief Complaint  Patient presents with  . Atrial Fibrillation    History of Present Illness: 64 yo male with history of OSA, former tobacco abuse, HTN, HLD, chronic diastolic CHF and paroxysmal atrial fibrillation who is here today for cardiac follow up after recent recurrence of atrial fibrillation. He has been followed in the past by Dr. Aundra Dubin but has not been seen in our office since 2012. He had a ruptured appendix in in 2011 and post-appendectomy, he developed atrial fibrillation. This was somewhat difficult to control and did not spontaneously convert, so TEE-guided cardioversion was done. He was on coumadin for about 6 wks after cardioversion and then it was stopped as his CHADSVASC score was 1. He has been on ASA over the last 4 years. He has been feeling well. He was seen in primary care and EKG showed atrial fibrillation. He has no awareness of irregularity of his heart rhythm. No dizziness, weakness, near syncope or syncope.   Primary Care Physician: Polite   Past Medical History  Diagnosis Date  . Anxiety   . Obstructive sleep apnea on CPAP   . Kidney stones   . Insomnia   . Tobacco abuse QUIT 2014  . HTN (hypertension)   . Overweight   . Atrial fibrillation ([place])   . Chronic diastolic CHF (congestive heart failure) ([place])   . Hypercholesterolemia   . Basal cell carcinoma of skin     Past Surgical History  Procedure Laterality Date  . Appendectomy    . Tonsillectomy      Current Outpatient Prescriptions  Medication Sig Dispense Refill  . apixaban (ELIQUIS) 5 MG TABS tablet Take 5 mg by mouth 2 (two) times daily.    Marland Kitchen losartan (COZAAR) 50 MG tablet Take 50 mg by mouth daily.    . Multiple Vitamin (MULTI VITAMIN PO) Take 1 tablet by mouth daily.    . Multiple Vitamin (MULTIVITAMIN) capsule Take 1 capsule by mouth daily.    Marland Kitchen co-enzyme Q-10 30 MG capsule Take 100 mg by mouth 3 (three) times daily.    . metoprolol succinate (TOPROL-XL) 50 MG 24 hr tablet Take 1  tablet (50 mg total) by mouth 2 (two) times daily. Take with or immediately following a meal. 60 tablet 6  . simvastatin (ZOCOR) 40 MG tablet Take 1 tablet (40 mg total) by mouth every evening. 30 tablet 3   No current facility-administered medications for this visit.    No Known Allergies  Social History   Social History  . Marital Status: Single    Spouse Name: N/A  . Number of Children: N/A  . Years of Education: N/A   Occupational History  . Not on file.   Social History Main Topics  . Smoking status: Former Smoker -- 40 years    Types: Cigarettes    Quit date: 06/30/2013  . Smokeless tobacco: Not on file  . Alcohol Use: 0.0 oz/week    0 Standard drinks or equivalent per week  . Drug Use: Not on file  . Sexual Activity: Not on file   Other Topics Concern  . Not on file   Social History Narrative    Family History  Problem Relation Age of Onset  . Hypertension Father   . Cancer Father     LYMPHOMA  . Hypertension Mother   . Coronary artery disease    . Hypercholesterolemia    . Hypertension Brother   . Hypertension Brother   . Hypertension Sister  Review of Systems:  As stated in the HPI and otherwise negative.   BP 120/80 mmHg  Pulse 126  Ht 6\' 3"  (1.905 m)  Wt 261 lb 6.4 oz (118.57 kg)  BMI 32.67 kg/m2  SpO2 96%  Physical Examination: General: Well developed, well nourished, NAD HEENT: OP clear, mucus membranes moist SKIN: warm, dry. No rashes. Neuro: No focal deficits Musculoskeletal: Muscle strength 5/5 all ext Psychiatric: Mood and affect normal Neck: No JVD, no carotid bruits, no thyromegaly, no lymphadenopathy. Lungs:Clear bilaterally, no wheezes, rhonci, crackles Cardiovascular: Regular rate and rhythm. No murmurs, gallops or rubs. Abdomen:Soft. Bowel sounds present. Non-tender.  Extremities: No lower extremity edema. Pulses are 2 + in the bilateral DP/PT.  EKG:  EKG is ordered today. The ekg ordered today demonstrates Atrial fib,  rate 126 bpm.   Recent Labs: No results found for requested labs within last 365 days.   Wt Readings from Last 3 Encounters:  07/01/15 261 lb 6.4 oz (118.57 kg)  10/11/10 239 lb (108.41 kg)  07/11/10 236 lb (107.049 kg)     Other studies Reviewed: Additional studies/ records that were reviewed today include: . Review of the above records demonstrates:    Assessment and Plan:   1. Atrial fibrillation, paroxysmal: He is in atrial fib today. He is asymptomatic. Rate is not well controlled on Toprol 50 mg daily. Will increase Toprol to 50 mg po BID. Will continue Eliquis 5 mg po BID. Labs from primary care 06/29/15 reviewed and scanned in . (Creatinine 1.18, H/H normal). My plan would be for rate control and anti-coagulation for 4 weeks and if he is still in atrial fib, can consider DCCV vs rate control. His CHADS VASC score is 1. Will formulate plan for long term anticoagulation at f/u in 4-6 weeks. I will have him follow up in the atrial fib clinic next week to assess rate control. He will follow up with his cardiologist Dr. Aundra Dubin in 4-6 weeks.    2. HTN: BP stable. No changes.   3. Hyperlipidemia: Continue statin.   Current medicines are reviewed at length with the patient today.  The patient does not have concerns regarding medicines.  The following changes have been made:  no change  Labs/ tests ordered today include:   Orders Placed This Encounter  Procedures  . EKG 12-Lead    Disposition:   FU with Dr. Aundra Dubin in 6 weeks  Signed, Lauree Chandler, MD 07/01/2015 3:11 PM    [address], [place], [place]  [address] Phone: [phone]; Fax: [phone]

## 2015-07-01 NOTE — Patient Instructions (Signed)
Medication Instructions:  Your physician has recommended you make the following change in your medication: Increase Toprol to 50 mg by mouth twice daily.    Labwork: none  Testing/Procedures: none  Follow-Up: Your physician recommends that you schedule a follow-up appointment in: 7-10 days with Roderic Palau, NP in atrial fib clinic and 4-6 weeks with Dr. Aundra Dubin.

## 2015-07-05 ENCOUNTER — Telehealth: Payer: Self-pay | Admitting: Cardiovascular Disease

## 2015-07-05 NOTE — Telephone Encounter (Signed)
Walk in pt form-ACS Benefit Disability paper-dropped off by patient sent interoffice to [place].

## 2015-07-08 ENCOUNTER — Ambulatory Visit (HOSPITAL_COMMUNITY)
Admission: RE | Admit: 2015-07-08 | Discharge: 2015-07-08 | Disposition: A | Discharge status: Home / Self Care | Payer: 59 | Source: Ambulatory Visit | Attending: Nurse Practitioner | Admitting: Nurse Practitioner

## 2015-07-08 VITALS — BP 116/72 | HR 126 | Ht 75.0 in | Wt 262.0 lb

## 2015-07-08 DIAGNOSIS — G4733 Obstructive sleep apnea (adult) (pediatric): Secondary | ICD-10-CM | POA: Diagnosis not present

## 2015-07-08 DIAGNOSIS — I481 Persistent atrial fibrillation: Secondary | ICD-10-CM | POA: Diagnosis present

## 2015-07-08 DIAGNOSIS — I4819 Other persistent atrial fibrillation: Secondary | ICD-10-CM

## 2015-07-08 DIAGNOSIS — I10 Essential (primary) hypertension: Secondary | ICD-10-CM | POA: Diagnosis not present

## 2015-07-08 DIAGNOSIS — E669 Obesity, unspecified: Secondary | ICD-10-CM | POA: Diagnosis not present

## 2015-07-08 MED ORDER — DILTIAZEM HCL ER COATED BEADS 120 MG PO CP24: 120.0000 mg | ORAL_CAPSULE | Freq: Every day | ORAL | Status: DC

## 2015-07-08 NOTE — Patient Instructions (Signed)
Your physician has recommended you make the following change in your medication:  1)Decrease losartan to 25mg  once a day (1/2 tablet) 2)Cardizem 120mg  once a day

## 2015-07-09 ENCOUNTER — Encounter (HOSPITAL_COMMUNITY): Payer: Self-pay | Admitting: Nurse Practitioner

## 2015-07-09 NOTE — Progress Notes (Addendum)
Patient ID: Austin Park, male   DOB: 04-27-1951, 64 y.o.   MRN: [id number]     Primary Care Physician: Austin Hams, MD Referring Physician: Dr. Nilda Riggs Park is a 64 y.o. male with a h/o OSA, former tobacco abuse, HTN, HLD, chronic diastolic CHF and paroxysmal atrial fibrillation who is here today for  follow up  In the afib clinic after recent recurrence of atrial fibrillation. He had a ruptured appendix in in 2011 and post-appendectomy, he developed atrial fibrillation. This was somewhat difficult to control and did not spontaneously convert, so TEE-guided cardioversion was done. He was on coumadin for about 6 wks after cardioversion and then it was stopped as his CHADSVASC score was 1. He has been on ASA over the last 4 years. He has been feeling well. He was seen in primary care and EKG showed atrial fibrillation. He was asymptomatic with no awareness of irregularity of his heart rhythm. No dizziness, weakness, near syncope or syncope.  When seen by Dr. Angelena Park, toprol xl 50 mg was increased to bid, he was started on eliquis 5mg  at the time he saw his primary,but he was taking it wrong for around a week thinking it was only once a day. He is now taking correctly at bid since seeing Dr. Angelena Park.  In the afib clinic today, unfortunately still has afib with RVR with v rate at 126. He continue to be asymptomatic with this. He does have OSA but is using CPAP. No significant alcohol or caffeine use. No regular exercise program, is obese..  Today, he denies symptoms of palpitations, chest pain, shortness of breath, orthopnea, PND, lower extremity edema, dizziness, presyncope, syncope, or neurologic sequela. The patient is tolerating medications without difficulties and is otherwise without complaint today.   Past Medical History  Diagnosis Date  . Anxiety   . Obstructive sleep apnea on CPAP   . Kidney stones   . Insomnia   . Tobacco abuse QUIT 2014  . HTN (hypertension)   .  Overweight   . Atrial fibrillation ([place])   . Chronic diastolic CHF (congestive heart failure) ([place])   . Hypercholesterolemia   . Basal cell carcinoma of skin    Past Surgical History  Procedure Laterality Date  . Appendectomy    . Tonsillectomy      Current Outpatient Prescriptions  Medication Sig Dispense Refill  . apixaban (ELIQUIS) 5 MG TABS tablet Take 5 mg by mouth 2 (two) times daily.    Marland Kitchen co-enzyme Q-10 30 MG capsule Take 100 mg by mouth 3 (three) times daily.    Marland Kitchen losartan (COZAAR) 50 MG tablet Take 25 mg by mouth daily.    . metoprolol succinate (TOPROL-XL) 50 MG 24 hr tablet Take 1 tablet (50 mg total) by mouth 2 (two) times daily. Take with or immediately following a meal. 60 tablet 6  . Multiple Vitamin (MULTI VITAMIN PO) Take 1 tablet by mouth daily.    . Multiple Vitamin (MULTIVITAMIN) capsule Take 1 capsule by mouth daily.    Marland Kitchen diltiazem (CARDIZEM CD) 120 MG 24 hr capsule Take 1 capsule (120 mg total) by mouth daily. 30 capsule 1  . simvastatin (ZOCOR) 40 MG tablet Take 1 tablet (40 mg total) by mouth every evening. 30 tablet 3   No current facility-administered medications for this encounter.    No Known Allergies  Social History   Social History  . Marital Status: Single    Spouse Name: N/A  . Number of  Children: N/A  . Years of Education: N/A   Occupational History  . Not on file.   Social History Main Topics  . Smoking status: Former Smoker -- 40 years    Types: Cigarettes    Quit date: 06/30/2013  . Smokeless tobacco: Not on file  . Alcohol Use: 0.0 oz/week    0 Standard drinks or equivalent per week  . Drug Use: Not on file  . Sexual Activity: Not on file   Other Topics Concern  . Not on file   Social History Narrative    Family History  Problem Relation Age of Onset  . Hypertension Father   . Cancer Father     LYMPHOMA  . Hypertension Mother   . Coronary artery disease    . Hypercholesterolemia    . Hypertension Brother   .  Hypertension Brother   . Hypertension Sister     ROS- All systems are reviewed and negative except as per the HPI above  Physical Exam: Filed Vitals:   07/08/15 0949  BP: 116/72  Pulse: 126  Height: 6\' 3"  (1.905 m)  Weight: 262 lb (118.842 kg)    GEN- The patient is well appearing, alert and oriented x 3 today.   Head- normocephalic, atraumatic Eyes-  Sclera clear, conjunctiva pink Ears- hearing intact Oropharynx- clear Neck- supple, no JVP Lymph- no cervical lymphadenopathy Lungs- Clear to ausculation bilaterally, normal work of breathing Heart- irregular rate and rhythm, no murmurs, rubs or gallops, PMI not laterally displaced GI- soft, NT, ND, + BS Extremities- no clubbing, cyanosis, or edema MS- no significant deformity or atrophy Skin- no rash or lesion Psych- euthymic mood, full affect Neuro- strength and sensation are intact  EKG- Afib with rvr at 126 bpm, qrs int 76 ms, qtc, 451 ms Epic records reviewed Creat 1.18, H/H normal Echo-10/ 2011-Left ventricle: The cavity size was normal. Wall thickness was  increased in a pattern of mild LVH. Systolic function was normal.  The estimated ejection fraction was in the range of 60% to 65%.  Wall motion was normal; there were no regional wall motion  abnormalities. - Left atrium: The atrium was mildly to moderately dilated. 50 mm Impressions:  - No cardiac source of emboli was indentified  Assessment and Plan: 1. Persistent symptomatic afib Continue BB bid Add Cardizem 120 mg qd  2. Chadsvasc score of 1 Continue eliquis with plans for cardioversion when fully anticoagulated  3. OSA Continue to use cpap  4. Obesity Weight loss/exercise recommended  5. HTN Will reduce losartan to 25 mg a day to help prevent hypotension with the addition of cardizem, with BP today 116/72  F/u in afib clinic next week to determine rate control with addition of cardizem  Austin Park as scheduled in 4-6 weeks  Austin Park, [facility] [address] [place], [place] [address] [phone]

## 2015-07-09 NOTE — Addendum Note (Signed)
Encounter addended by: Sherran Needs, NP on: 07/09/2015  8:32 AM<BR>     Documentation filed: Notes Section

## 2015-07-12 ENCOUNTER — Ambulatory Visit (HOSPITAL_COMMUNITY)
Admission: RE | Admit: 2015-07-12 | Discharge: 2015-07-12 | Disposition: A | Discharge status: Home / Self Care | Payer: 59 | Source: Ambulatory Visit | Attending: Nurse Practitioner | Admitting: Nurse Practitioner

## 2015-07-12 DIAGNOSIS — I480 Paroxysmal atrial fibrillation: Secondary | ICD-10-CM

## 2015-07-12 DIAGNOSIS — I4891 Unspecified atrial fibrillation: Secondary | ICD-10-CM | POA: Diagnosis not present

## 2015-07-12 MED ORDER — DILTIAZEM HCL ER COATED BEADS 120 MG PO CP24
120.0000 mg | ORAL_CAPSULE | Freq: Two times a day (BID) | ORAL | Status: DC
Start: 2015-07-12 — End: 2015-07-26

## 2015-07-12 NOTE — Patient Instructions (Signed)
Your physician has recommended you make the following change in your medication:  1)Increase cardizem 120mg  to twice a day  Call back with heart rate and blood pressure [phone]

## 2015-07-12 NOTE — Progress Notes (Addendum)
Pt's Visit was EKG only. [place]ch Penny will review with pt.  Pt still has afib with RVR with metoprolol 50 mg bid and cardizem 120 mg added last week. BP 124/80. He is tolerating well. Has been on blood thinner correctly since 10/20( prior to that was taking eliquis once a day for around one week. Will increase Cardizem to 120 mg bid. Pt will check his BP/HR at home and let me know results as of wed/thurs. Plan is to fully anticoagulated after 3 weeks and cardiovert. Will bring back in 10 days and discuss setting up cardioversion.

## 2015-07-16 ENCOUNTER — Telehealth (HOSPITAL_COMMUNITY): Payer: Self-pay | Admitting: *Deleted

## 2015-07-16 NOTE — Telephone Encounter (Signed)
Pt called in follow up blood pressure -- 11/2 128/78 and 11/4 126/74. Patient states feeling fine no issues.

## 2015-07-22 ENCOUNTER — Inpatient Hospital Stay (HOSPITAL_COMMUNITY): Admission: RE | Admit: 2015-07-22 | Payer: 59 | Source: Ambulatory Visit | Admitting: Nurse Practitioner

## 2015-07-26 ENCOUNTER — Other Ambulatory Visit (HOSPITAL_COMMUNITY): Payer: Self-pay | Admitting: *Deleted

## 2015-07-26 MED ORDER — DILTIAZEM HCL ER COATED BEADS 120 MG PO CP24: ORAL_CAPSULE | ORAL | Status: DC

## 2015-07-26 NOTE — Telephone Encounter (Signed)
Pt called in stating he would have to push back his upcoming appointment due to family emergency in [place]. Needing prescription for cardizem called in to nearby pharmacy. HR is still running 110-130. BP 133/84. Discussed with Roderic Palau NP will increase cardizem to 240mg  in the morning and 120mg  in the evening. Patient will call for follow up appointment once back in town.

## 2015-07-27 ENCOUNTER — Inpatient Hospital Stay (HOSPITAL_COMMUNITY): Admission: RE | Admit: 2015-07-27 | Payer: 59 | Source: Ambulatory Visit | Admitting: Nurse Practitioner

## 2015-08-04 ENCOUNTER — Ambulatory Visit (HOSPITAL_COMMUNITY)
Admission: RE | Admit: 2015-08-04 | Discharge: 2015-08-04 | Disposition: A | Discharge status: Home / Self Care | Payer: 59 | Source: Ambulatory Visit | Attending: Nurse Practitioner | Admitting: Nurse Practitioner

## 2015-08-04 ENCOUNTER — Encounter (HOSPITAL_COMMUNITY): Payer: Self-pay | Admitting: Nurse Practitioner

## 2015-08-04 VITALS — BP 106/72 | HR 88 | Ht 75.0 in | Wt 268.6 lb

## 2015-08-04 DIAGNOSIS — G4733 Obstructive sleep apnea (adult) (pediatric): Secondary | ICD-10-CM | POA: Insufficient documentation

## 2015-08-04 DIAGNOSIS — I481 Persistent atrial fibrillation: Secondary | ICD-10-CM | POA: Diagnosis present

## 2015-08-04 DIAGNOSIS — I4819 Other persistent atrial fibrillation: Secondary | ICD-10-CM

## 2015-08-04 LAB — BASIC METABOLIC PANEL
Anion gap: 8 (ref 5–15)
BUN: 16 mg/dL (ref 6–20)
CHLORIDE: 105 mmol/L (ref 101–111)
CO2: 24 mmol/L (ref 22–32)
CREATININE: 1.05 mg/dL (ref 0.61–1.24)
Calcium: 9.4 mg/dL (ref 8.9–10.3)
GFR calc Af Amer: >60 mL/min (ref >60)
GFR calc non Af Amer: >60 mL/min (ref >60)
GLUCOSE: 121 mg/dL — AB (ref 65–99)
Potassium: 4.6 mmol/L (ref 3.5–5.1)
SODIUM: 137 mmol/L (ref 135–145)

## 2015-08-04 LAB — CBC
HCT: 51.2 % (ref 39.0–52.0)
HEMOGLOBIN: 17.5 g/dL — AB (ref 13.0–17.0)
MCH: 31.1 pg (ref 26.0–34.0)
MCHC: 34.2 g/dL (ref 30.0–36.0)
MCV: 91.1 fL (ref 78.0–100.0)
Platelets: 193 10*3/uL (ref 150–400)
RBC: 5.62 MIL/uL (ref 4.22–5.81)
RDW: 13.2 % (ref 11.5–15.5)
WBC: 10.2 10*3/uL (ref 4.0–10.5)

## 2015-08-04 NOTE — H&P (Signed)
Patient ID: Austin Park, male   DOB: 03-18-1951, 64 y.o.   MRN: [id number]     Primary Care Physician: [name], MD Referring Physician: Dr. Nilda Riggs Cozine is a 64 y.o. male with a h/o persistent afib that was referred to the afib clinic by Dr. Angelena Form 10/27 for f/u of afib management. He still was not rate controlled and additional meds were added. He had been placed on blood thinner and has been taking correctly without missed doses since 10/20.  He was suppose to be set up to return to afib clinic 11/10, to be set up for DCCV, but his mothers health took  a turn for the worse, with her death last week. He has been in the [place] area and just returned home. He is rate controlled and feels well. No issues with blood thinner. Following the plan from  Dr. Reatha Armour note, he has now been on blood thinners x one month and will be set up for cardioversion. He will f/u with Dr. Aundra Dubin as scheduled 12/15. 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 He was under the impression he could not drive if he is on blood thinners, but will double check with employer today. He has not had an echo since 2011, but  I will  obtain after cardioversion, prior to Dr. Claris Gladden visit, to assess for structural changes, to help guide decisions with his returning to work as a  Administrator. He is using CPAP.  Today, he denies symptoms of palpitations, chest pain, shortness of breath, orthopnea, PND, lower extremity edema, dizziness, presyncope, syncope, or neurologic sequela. The patient is tolerating medications without difficulties and is otherwise without complaint today.   Past Medical History  Diagnosis Date  . Anxiety   . Obstructive sleep apnea on CPAP   . Kidney stones   . Insomnia   . Tobacco abuse QUIT 2014  . HTN (hypertension)   . Overweight   . Atrial fibrillation ([place])   . Chronic diastolic CHF (congestive heart failure) ([place])   . Hypercholesterolemia    . Basal cell carcinoma of skin    Past Surgical History  Procedure Laterality Date  . Appendectomy    . Tonsillectomy      Current Outpatient Prescriptions  Medication Sig Dispense Refill  . apixaban (ELIQUIS) 5 MG TABS tablet Take 5 mg by mouth 2 (two) times daily.    Marland Kitchen co-enzyme Q-10 30 MG capsule Take 100 mg by mouth 3 (three) times daily.    Marland Kitchen diltiazem (CARDIZEM CD) 120 MG 24 hr capsule Take 2 tablets (240mg ) in the morning and 1 tablet (120mg ) in the evening 90 capsule 3  . losartan (COZAAR) 50 MG tablet Take 25 mg by mouth daily.    . metoprolol succinate (TOPROL-XL) 50 MG 24 hr tablet Take 1 tablet (50 mg total) by mouth 2 (two) times daily. Take with or immediately following a meal. 60 tablet 6  . Multiple Vitamin (MULTI VITAMIN PO) Take 1 tablet by mouth daily.    . Multiple Vitamin (MULTIVITAMIN) capsule Take 1 capsule by mouth daily.    . simvastatin (ZOCOR) 40 MG tablet Take 1 tablet (40 mg total) by mouth every evening. 30 tablet 3   No current facility-administered medications for this encounter.    No Known Allergies  Social History   Social History  . Marital Status: Single    Spouse Name: N/A  . Number  of Children: N/A  . Years of Education: N/A   Occupational History  . Not on file.   Social History Main Topics  . Smoking status: Former Smoker -- 40 years    Types: Cigarettes    Quit date: 06/30/2013  . Smokeless tobacco: Not on file  . Alcohol Use: 0.0 oz/week    0 Standard drinks or equivalent per week  . Drug Use: Not on file  . Sexual Activity: Not on file   Other Topics Concern  . Not on file   Social History Narrative    Family History  Problem Relation Age of Onset  . Hypertension Father   . Cancer Father     LYMPHOMA  . Hypertension Mother   . Coronary artery disease    . Hypercholesterolemia    . Hypertension Brother   . Hypertension Brother   . Hypertension Sister     ROS- All systems are reviewed and negative except as  per the HPI above  Physical Exam: Filed Vitals:   08/04/15 0853  BP: 106/72  Pulse: 88  Height: 6\' 3"  (1.905 m)  Weight: 268 lb 9.6 oz (121.836 kg)    GEN- The patient is well appearing, alert and oriented x 3 today.   Head- normocephalic, atraumatic Eyes-  Sclera clear, conjunctiva pink Ears- hearing intact Oropharynx- clear Neck- supple, no JVP Lymph- no cervical lymphadenopathy Lungs- Clear to ausculation bilaterally, normal work of breathing Heart- Irregular rate and rhythm, no murmurs, rubs or gallops, PMI not laterally displaced GI- soft, NT, ND, + BS Extremities- no clubbing, cyanosis, or edema MS- no significant deformity or atrophy Skin- no rash or lesion Psych- euthymic mood, full affect Neuro- strength and sensation are intact  EKG- afib with v rate 88 bpm, qrs int 82 ms, qtc 459 ms.  Epic records reviewed  Assessment and Plan: 1. Persistent afib Continue  CCB/BB for rate control Continue eliquis without missed doses. States no missed doses since starting bid 10/20.  Echo pending after return to sinus rhythm( last echo 2011) Procedure labs today  2. OSA Continue cpap  3. Employer driving restrictions as local beer distributor He is out of work by Dr. Julianne Handler until 11/30. He will check with employer today for restrictions but believes that he cannot return if on blood thinners. He was reminded he will have to take at least 30 days following DCCV. Right now chadsvasc score of 1 for HTN. If needed I will continue him out of work until seen by Dr. Aundra Dubin.  Geroge Baseman Javen Ridings, [facility] [address] [place], [place] [address] [phone]

## 2015-08-04 NOTE — Patient Instructions (Signed)
Cardioversion scheduled for Monday, November 28th  - Arrive at the [facility] and go to admitting at 12PM  -Do not eat or drink anything after midnight the night prior to your procedure.  - Take all your medication with a sip of water prior to arrival.  - You will not be able to drive home after your procedure.  Your physician has requested that you have an echocardiogram. Echocardiography is a painless test that uses sound waves to create images of your heart. It provides your doctor with information about the size and shape of your heart and how well your heart's chambers and valves are working. This procedure takes approximately one hour. There are no restrictions for this procedure.

## 2015-08-09 ENCOUNTER — Ambulatory Visit (HOSPITAL_COMMUNITY): Payer: 59 | Admitting: Anesthesiology

## 2015-08-09 ENCOUNTER — Encounter (HOSPITAL_COMMUNITY)
Admission: RE | Disposition: A | Discharge status: Home / Self Care | Payer: Self-pay | Source: Ambulatory Visit | Attending: Internal Medicine

## 2015-08-09 ENCOUNTER — Encounter (HOSPITAL_COMMUNITY): Payer: Self-pay | Admitting: Internal Medicine

## 2015-08-09 ENCOUNTER — Ambulatory Visit (HOSPITAL_COMMUNITY)
Admission: RE | Admit: 2015-08-09 | Discharge: 2015-08-09 | Disposition: A | Discharge status: Home / Self Care | Payer: 59 | Source: Ambulatory Visit | Attending: Internal Medicine | Admitting: Internal Medicine

## 2015-08-09 DIAGNOSIS — F419 Anxiety disorder, unspecified: Secondary | ICD-10-CM | POA: Diagnosis not present

## 2015-08-09 DIAGNOSIS — I10 Essential (primary) hypertension: Secondary | ICD-10-CM | POA: Diagnosis not present

## 2015-08-09 DIAGNOSIS — Z87891 Personal history of nicotine dependence: Secondary | ICD-10-CM | POA: Insufficient documentation

## 2015-08-09 DIAGNOSIS — I4891 Unspecified atrial fibrillation: Secondary | ICD-10-CM | POA: Insufficient documentation

## 2015-08-09 DIAGNOSIS — G4730 Sleep apnea, unspecified: Secondary | ICD-10-CM | POA: Diagnosis not present

## 2015-08-09 DIAGNOSIS — I480 Paroxysmal atrial fibrillation: Secondary | ICD-10-CM | POA: Insufficient documentation

## 2015-08-09 DIAGNOSIS — Z79899 Other long term (current) drug therapy: Secondary | ICD-10-CM | POA: Insufficient documentation

## 2015-08-09 HISTORY — PX: CARDIOVERSION: SHX1299

## 2015-08-09 SURGERY — CARDIOVERSION
Anesthesia: Monitor Anesthesia Care

## 2015-08-09 MED ORDER — SODIUM CHLORIDE 0.9 % IV SOLN
INTRAVENOUS | Status: DC
  Administered 2015-08-09: 12:00:00 via INTRAVENOUS

## 2015-08-09 MED ORDER — SODIUM CHLORIDE 0.9 % IV SOLN
INTRAVENOUS | Status: DC | PRN
  Administered 2015-08-09: 14:00:00 via INTRAVENOUS

## 2015-08-09 MED ORDER — PROPOFOL 10 MG/ML IV BOLUS
INTRAVENOUS | Status: DC | PRN
  Administered 2015-08-09 (×2): 30 mg via INTRAVENOUS
  Administered 2015-08-09: 70 mg via INTRAVENOUS

## 2015-08-09 MED ORDER — LIDOCAINE HCL (CARDIAC) 20 MG/ML IV SOLN
INTRAVENOUS | Status: DC | PRN
  Administered 2015-08-09: 60 mg via INTRAVENOUS

## 2015-08-09 NOTE — H&P (Signed)
     INTERVAL PROCEDURE H&P  History and Physical Interval Note:  08/09/2015 1:15 PM  [name] has presented today for their planned procedure. The various methods of treatment have been discussed with the patient and family. After consideration of risks, benefits and other options for treatment, the patient has consented to the procedure.  The patients' outpatient history has been reviewed, patient examined, and no change in status from most recent office note within the past 30 days. I have reviewed the patients' chart and labs and will proceed as planned. Questions were answered to the patient's satisfaction.   Austin Casino, MD, [facility] Attending Cardiologist [facility]  Austin Park 08/09/2015, 1:15 PM

## 2015-08-09 NOTE — Discharge Instructions (Signed)
Electrical Cardioversion, Care After °Refer to this sheet in the next few weeks. These instructions provide you with information on caring for yourself after your procedure. Your health care provider may also give you more specific instructions. Your treatment has been planned according to current medical practices, but problems sometimes occur. Call your health care provider if you have any problems or questions after your procedure. °WHAT TO EXPECT AFTER THE PROCEDURE °After your procedure, it is typical to have the following sensations: °· Some redness on the skin where the shocks were delivered. If this is tender, a sunburn lotion or hydrocortisone cream may help. °· Possible return of an abnormal heart rhythm within hours or days after the procedure. °HOME CARE INSTRUCTIONS °· Take medicines only as directed by your health care provider. Be sure you understand how and when to take your medicine. °· Learn how to feel your pulse and check it often. °· Limit your activity for 48 hours after the procedure or as directed by your health care provider. °· Avoid or minimize caffeine and other stimulants as directed by your health care provider. °SEEK MEDICAL CARE IF: °· You feel like your heart is beating too fast or your pulse is not regular. °· You have any questions about your medicines. °· You have bleeding that will not stop. °SEEK IMMEDIATE MEDICAL CARE IF: °· You are dizzy or feel faint. °· It is hard to breathe or you feel short of breath. °· There is a change in discomfort in your chest. °· Your speech is slurred or you have trouble moving an arm or leg on one side of your body. °· You get a serious muscle cramp that does not go away. °· Your fingers or toes turn cold or blue. °  °This information is not intended to replace advice given to you by your health care provider. Make sure you discuss any questions you have with your health care provider. °  °Document Released: 06/18/2013 Document Revised: 09/18/2014  Document Reviewed: 06/18/2013 °Elsevier Interactive Patient Education ©2016 Elsevier Inc. ° °

## 2015-08-09 NOTE — Anesthesia Preprocedure Evaluation (Addendum)
Anesthesia Evaluation  Patient identified by MRN, date of birth, ID band Patient awake    Reviewed: Allergy & Precautions, NPO status , Patient's Chart, lab work & pertinent test results, reviewed documented beta blocker date and time   Airway Mallampati: II       Dental  (+) Teeth Intact   Pulmonary sleep apnea and Continuous Positive Airway Pressure Ventilation , former smoker,    breath sounds clear to auscultation       Cardiovascular hypertension, Pt. on medications and Pt. on home beta blockers +CHF   Rhythm:Irregular Rate:Abnormal     Neuro/Psych PSYCHIATRIC DISORDERS Anxiety negative neurological ROS     GI/Hepatic negative GI ROS, Neg liver ROS,   Endo/Other  negative endocrine ROS  Renal/GU Renal InsufficiencyRenal disease  negative genitourinary   Musculoskeletal negative musculoskeletal ROS (+)   Abdominal   Peds negative pediatric ROS (+)  Hematology negative hematology ROS (+)   Anesthesia Other Findings   Reproductive/Obstetrics negative OB ROS                            Lab Results  Component Value Date   WBC 10.2 08/04/2015   HGB 17.5* 08/04/2015   HCT 51.2 08/04/2015   MCV 91.1 08/04/2015   PLT 193 08/04/2015   Lab Results  Component Value Date   CREATININE 1.05 08/04/2015   BUN 16 08/04/2015   NA 137 08/04/2015   K 4.6 08/04/2015   CL 105 08/04/2015   CO2 24 08/04/2015   Lab Results  Component Value Date   INR 2.4 07/14/2010   INR 2.8 07/07/2010   INR 3.0 06/30/2010   EKG: atrial fibrillation.   Anesthesia Physical Anesthesia Plan  ASA: III  Anesthesia Plan: MAC   Post-op Pain Management:    Induction: Intravenous  Airway Management Planned: Natural Airway, Mask and Nasal Cannula  Additional Equipment:   Intra-op Plan:   Post-operative Plan:   Informed Consent: I have reviewed the patients History and Physical, chart, labs and  discussed the procedure including the risks, benefits and alternatives for the proposed anesthesia with the patient or authorized representative who has indicated his/her understanding and acceptance.   Dental advisory given  Plan Discussed with: CRNA  Anesthesia Plan Comments:         Anesthesia Quick Evaluation

## 2015-08-09 NOTE — Transfer of Care (Signed)
Immediate Anesthesia Transfer of Care Note  Patient: Austin Park  Procedure(s) Performed: Procedure(s): CARDIOVERSION (N/A)  Patient Location: PACU  Anesthesia Type:General  Level of Consciousness: awake, alert , oriented and patient cooperative  Airway & Oxygen Therapy: Patient Spontanous Breathing and Patient connected to nasal cannula oxygen  Post-op Assessment: Report given to RN, Post -op Vital signs reviewed and stable and Patient moving all extremities  Post vital signs: Reviewed and stable  Last Vitals:  Filed Vitals:   08/09/15 1205  BP: 134/93  Pulse: 85  Temp: 36.7 C  Resp: 18    Complications: No apparent anesthesia complications

## 2015-08-09 NOTE — CV Procedure (Signed)
    CARDIOVERSION NOTE  Procedure: Electrical Cardioversion Indications:  Atrial Fibrillation  Procedure Details:  Consent: Risks of procedure as well as the alternatives and risks of each were explained to the (patient/caregiver).  Consent for procedure obtained.  Time Out: Verified patient identification, verified procedure, site/side was marked, verified correct patient position, special equipment/implants available, medications/allergies/relevent history reviewed, required imaging and test results available.  Performed  Patient placed on cardiac monitor, pulse oximetry, supplemental oxygen as necessary.  Sedation given: Propofol per anesthesia Pacer pads placed anterior and posterior chest.  Cardioverted 3 time(s).  Cardioverted at 150J, 200J and 200J.  Impression: Findings: Post procedure EKG shows: Atrial Fibrillation Complications: None Patient did tolerate procedure well.  Plan: 1. Unsuccessful DCCV - brief period of sinus, but then reverted back to a-fib. 2. Follow-up with Roderic Palau, NP, may need anti-arrhythmic medication.  Time Spent Directly with the Patient:  30 minutes   [name], MD, [facility] Attending Cardiologist [place] 08/09/2015, 1:59 PM

## 2015-08-09 NOTE — Anesthesia Postprocedure Evaluation (Signed)
Anesthesia Post Note  Patient: [place] Park  Procedure(s) Performed: Procedure(s) (LRB): CARDIOVERSION (N/A)  Patient location during evaluation: PACU Anesthesia Type: MAC Level of consciousness: awake and alert Pain management: pain level controlled Vital Signs Assessment: post-procedure vital signs reviewed and stable Respiratory status: spontaneous breathing, nonlabored ventilation, respiratory function stable and patient connected to nasal cannula oxygen Cardiovascular status: stable and blood pressure returned to baseline Anesthetic complications: no    Last Vitals:  Filed Vitals:   08/09/15 1340 08/09/15 1350  BP: 123/59 116/72  Pulse: 89 92  Temp:    Resp: 20 21    Last Pain: There were no vitals filed for this visit.               [name]

## 2015-08-10 ENCOUNTER — Encounter (HOSPITAL_COMMUNITY): Payer: Self-pay | Admitting: Internal Medicine

## 2015-08-10 ENCOUNTER — Ambulatory Visit (HOSPITAL_COMMUNITY): Admission: RE | Admit: 2015-08-10 | Payer: 59 | Source: Ambulatory Visit

## 2015-08-10 ENCOUNTER — Encounter (HOSPITAL_COMMUNITY): Payer: Self-pay | Admitting: *Deleted

## 2015-08-12 ENCOUNTER — Ambulatory Visit (HOSPITAL_COMMUNITY)
Admission: RE | Admit: 2015-08-12 | Discharge: 2015-08-12 | Disposition: A | Discharge status: Home / Self Care | Payer: 59 | Source: Ambulatory Visit | Attending: Nurse Practitioner | Admitting: Nurse Practitioner

## 2015-08-12 DIAGNOSIS — I517 Cardiomegaly: Secondary | ICD-10-CM | POA: Diagnosis not present

## 2015-08-12 DIAGNOSIS — I071 Rheumatic tricuspid insufficiency: Secondary | ICD-10-CM | POA: Diagnosis not present

## 2015-08-12 DIAGNOSIS — E785 Hyperlipidemia, unspecified: Secondary | ICD-10-CM | POA: Insufficient documentation

## 2015-08-12 DIAGNOSIS — I481 Persistent atrial fibrillation: Secondary | ICD-10-CM

## 2015-08-12 DIAGNOSIS — F17200 Nicotine dependence, unspecified, uncomplicated: Secondary | ICD-10-CM | POA: Insufficient documentation

## 2015-08-12 DIAGNOSIS — I10 Essential (primary) hypertension: Secondary | ICD-10-CM | POA: Insufficient documentation

## 2015-08-12 DIAGNOSIS — I4891 Unspecified atrial fibrillation: Secondary | ICD-10-CM | POA: Insufficient documentation

## 2015-08-12 DIAGNOSIS — I4819 Other persistent atrial fibrillation: Secondary | ICD-10-CM

## 2015-08-12 NOTE — Progress Notes (Signed)
  Echocardiogram 2D Echocardiogram has been performed.  Donata Clay 08/12/2015, 2:41 PM

## 2015-08-16 ENCOUNTER — Telehealth (HOSPITAL_COMMUNITY): Payer: Self-pay | Admitting: *Deleted

## 2015-08-16 ENCOUNTER — Other Ambulatory Visit (HOSPITAL_COMMUNITY): Payer: Self-pay | Admitting: *Deleted

## 2015-08-16 DIAGNOSIS — I4819 Other persistent atrial fibrillation: Secondary | ICD-10-CM

## 2015-08-16 MED ORDER — FLECAINIDE ACETATE 50 MG PO TABS: 50.0000 mg | ORAL_TABLET | Freq: Two times a day (BID) | ORAL | Status: DC

## 2015-08-16 NOTE — Telephone Encounter (Signed)
Roderic Palau NP discussed case with Dr. Aundra Dubin and it was decided to start patient on flecainide 50mg  twice daily and have myoview performed 3 days later to assess exercise tolerance with initiation of flecainide as well as assess for ischemia. Patient was scheduled for myoview (lexiscan) on 12/9 @ 1:45pm at [facility] office so patient will start flecainide 50mg  twice daily on 12/6. Will request EKG be faxed to Roderic Palau NP when starting lexiscan to assess EKG intervals prior to exercise. Patient verbalized understanding of instructions. Will follow up with Dr. Aundra Dubin next week as scheduled.

## 2015-08-18 ENCOUNTER — Telehealth (HOSPITAL_COMMUNITY): Payer: Self-pay

## 2015-08-18 NOTE — Telephone Encounter (Signed)
Encounter complete. 

## 2015-08-20 ENCOUNTER — Ambulatory Visit (HOSPITAL_COMMUNITY)
Admission: RE | Admit: 2015-08-20 | Discharge: 2015-08-20 | Disposition: A | Discharge status: Home / Self Care | Payer: 59 | Source: Ambulatory Visit | Attending: Cardiology | Admitting: Cardiology

## 2015-08-20 DIAGNOSIS — E669 Obesity, unspecified: Secondary | ICD-10-CM | POA: Insufficient documentation

## 2015-08-20 DIAGNOSIS — Z6833 Body mass index (BMI) 33.0-33.9, adult: Secondary | ICD-10-CM | POA: Diagnosis not present

## 2015-08-20 DIAGNOSIS — R5383 Other fatigue: Secondary | ICD-10-CM | POA: Diagnosis not present

## 2015-08-20 DIAGNOSIS — G4733 Obstructive sleep apnea (adult) (pediatric): Secondary | ICD-10-CM | POA: Diagnosis not present

## 2015-08-20 DIAGNOSIS — R42 Dizziness and giddiness: Secondary | ICD-10-CM | POA: Diagnosis not present

## 2015-08-20 DIAGNOSIS — Z87891 Personal history of nicotine dependence: Secondary | ICD-10-CM | POA: Insufficient documentation

## 2015-08-20 DIAGNOSIS — I4819 Other persistent atrial fibrillation: Secondary | ICD-10-CM

## 2015-08-20 DIAGNOSIS — I481 Persistent atrial fibrillation: Secondary | ICD-10-CM | POA: Diagnosis not present

## 2015-08-20 DIAGNOSIS — I10 Essential (primary) hypertension: Secondary | ICD-10-CM | POA: Diagnosis not present

## 2015-08-20 DIAGNOSIS — R002 Palpitations: Secondary | ICD-10-CM | POA: Diagnosis not present

## 2015-08-20 LAB — MYOCARDIAL PERFUSION IMAGING
CHL CUP NUCLEAR SDS: 0
CHL CUP NUCLEAR SRS: 0
CHL CUP RESTING HR STRESS: 82 {beats}/min
NUC STRESS TID: 1.19
Peak HR: 105 {beats}/min
SSS: 0

## 2015-08-20 MED ORDER — REGADENOSON 0.4 MG/5ML IV SOLN
0.4000 mg | Freq: Once | INTRAVENOUS | Status: AC
  Administered 2015-08-20: 0.4 mg via INTRAVENOUS

## 2015-08-20 MED ORDER — TECHNETIUM TC 99M SESTAMIBI GENERIC - CARDIOLITE
32.8000 | Freq: Once | INTRAVENOUS | Status: AC | PRN
  Administered 2015-08-20: 32.8 via INTRAVENOUS

## 2015-08-20 MED ORDER — AMINOPHYLLINE 25 MG/ML IV SOLN
125.0000 mg | Freq: Once | INTRAVENOUS | Status: AC
  Administered 2015-08-20: 125 mg via INTRAVENOUS

## 2015-08-20 MED ORDER — TECHNETIUM TC 99M SESTAMIBI GENERIC - CARDIOLITE
10.8000 | Freq: Once | INTRAVENOUS | Status: AC | PRN
  Administered 2015-08-20: 10.8 via INTRAVENOUS

## 2015-08-26 ENCOUNTER — Encounter: Payer: Self-pay | Admitting: Cardiology

## 2015-08-26 ENCOUNTER — Encounter: Payer: Self-pay | Admitting: *Deleted

## 2015-08-26 ENCOUNTER — Ambulatory Visit (INDEPENDENT_AMBULATORY_CARE_PROVIDER_SITE_OTHER): Payer: 59 | Admitting: Cardiology

## 2015-08-26 ENCOUNTER — Telehealth: Payer: Self-pay | Admitting: Cardiology

## 2015-08-26 VITALS — BP 118/70 | HR 83 | Ht 75.0 in | Wt 274.0 lb

## 2015-08-26 DIAGNOSIS — I480 Paroxysmal atrial fibrillation: Secondary | ICD-10-CM

## 2015-08-26 DIAGNOSIS — I10 Essential (primary) hypertension: Secondary | ICD-10-CM | POA: Diagnosis not present

## 2015-08-26 NOTE — Patient Instructions (Signed)
Medication Instructions:  No changes today  Labwork: None today  Testing/Procedures: Your physician has recommended that you have a Cardioversion (DCCV). Electrical Cardioversion uses a jolt of electricity to your heart either through paddles or wired patches attached to your chest. This is a controlled, usually prescheduled, procedure. Defibrillation is done under light anesthesia in the hospital, and you usually go home the day of the procedure. This is done to get your heart back into a normal rhythm. You are not awake for the procedure. Please see the instruction sheet given to you today. Thursday December 29,2016    Follow-Up: Your physician recommends that you schedule a follow-up appointment  1week after the cardioversion with Roderic Palau, NP int he At[facility] Fibrillation Clinic.  Your physician has requested that you have an exercise tolerance test. For further information please visit [email or web address]. Please also follow instruction sheet, as given. After you have had the cardioversion    Any Other Special Instructions Will Be Listed Below (If Applicable).     If you need a refill on your cardiac medications before your next appointment, please call your pharmacy.

## 2015-08-27 NOTE — Progress Notes (Signed)
Patient ID: [place] Park, male   DOB: 01-31-1951, 64 y.o.   MRN: [id number]  PCP: Dr. Delfina Redwood  64 yo with history of HTN and paroxysmal atrial fibrillation presents for followup. Patient had a ruptured appendix in 10/11. Post-appendectomy, he developed atrial fibrillation. This was somewhat difficult to control and did not spontaneously convert, so TEE-guided cardioversion was done. He was on coumadin for about 6 wks after cardioversion. We then stopped it and put him on ASA as his CHADSVASC score was only 1.  He developed recurrent atrial fibrillation with RVR in 11/16.  DCCV was unsuccessful.  He has been started on flecainide and Cardiolite showed no ischemia or infarction.  He remains in NSR. Today.  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  He is not noticing palpitations.  He does note new dyspnea with heavier exertion.  No dyspnea with usual ADLs.  No chest pain.  He is using CPAP regularly.    ECG: atrial fibrillation rate 83, QRS normal  PMH: 1. Hypertension 2. Acute appendicitis s/p appendectomy 10/11 (had rupture) 3. Atrial fibrillation: Post-op appendectdomy in 10/11, had TEE-guided DCCV.  Recurrent in 11/16.  Unsuccessful DCCV, started on flecainide.  4. Diastolic CHF in the setting of atrial fibrillation with RVR. Echo (10/11): EF 60-65%, mild LVH, mild to moderate LAE. Echo (12/16) with EF 55%, mild RV dilation with normal systolic function.  5. OSA on CPAP 6. Hyperlipidemia 7. Cardiolite (12/16): Normal study.   Family History: Mother with MI in her 65s, father with PCI at 47, father with AAA  Social History: The patient is married.  He teaches truck driving He has an occasional cigar here and there. He quit smoking cigarettes in 10/11 (1 pack/week prior) Occasional ETOH.   ROS: All systems reviewed and negative except as per HPI.    Current Outpatient Prescriptions  Medication Sig Dispense Refill  . acetaminophen (TYLENOL) 325 MG  tablet Take 325 mg by mouth every 6 (six) hours as needed for moderate pain.    Marland Kitchen apixaban (ELIQUIS) 5 MG TABS tablet Take 5 mg by mouth 2 (two) times daily.    Marland Kitchen diltiazem (CARDIZEM CD) 120 MG 24 hr capsule Take 2 tablets (240mg ) in the morning and 1 tablet (120mg ) in the evening 90 capsule 3  . flecainide (TAMBOCOR) 50 MG tablet Take 1 tablet (50 mg total) by mouth 2 (two) times daily. 60 tablet 3  . losartan (COZAAR) 50 MG tablet Take 25 mg by mouth daily.    . metoprolol succinate (TOPROL-XL) 50 MG 24 hr tablet Take 1 tablet (50 mg total) by mouth 2 (two) times daily. Take with or immediately following a meal. 60 tablet 6  . Multiple Vitamin (MULTI VITAMIN PO) Take 1 tablet by mouth daily.    . simvastatin (ZOCOR) 40 MG tablet Take 1 tablet (40 mg total) by mouth every evening. 30 tablet 3   No current facility-administered medications for this visit.   BP 118/70 mmHg  Pulse 83  Ht 6\' 3"  (1.905 m)  Wt 274 lb (124.286 kg)  BMI 34.25 kg/m2 General: NAD Neck: Thick, no JVD, no thyromegaly or thyroid nodule.  Lungs: Clear to auscultation bilaterally with normal respiratory effort. CV: Nondisplaced PMI.  Heart irregular S1/S2, no S3/S4, no murmur.  No peripheral edema.  No carotid bruit.  Normal pedal pulses.  Abdomen: Soft, nontender, no hepatosplenomegaly, no distention.  Skin: Intact without lesions or rashes.  Neurologic: Alert and oriented x 3.  Psych:  Normal affect. Extremities: No clubbing or cyanosis.  HEENT: Normal.   Assessment/Plan: 1. Atrial fibrillation: Persistent.  He is mildly symptomatic.  He failed DCCV without antiarrhythmics.  He has been on Eliquis and has now been started on flecainide 50 mg bid.  CHADSVASC = 1.  - Will plan for DCCV during the week between Christmas and New Years.  - Continue Eliquis, diltiazem CD, Toprol XL, and flecainide.  He had normal Cardiolite.  Will need ETT to assess for arrhythmias and QRS widening with exercise on flecainide.   -  Control risk factors: BP is ok, he is using CPAP.  Needs to limit ETOH.  2. OSA: Continue CPAP. 3. HTN; Controlled.   [name] 08/28/2015

## 2015-09-08 ENCOUNTER — Other Ambulatory Visit (HOSPITAL_COMMUNITY): Payer: Self-pay | Admitting: Cardiology

## 2015-09-09 ENCOUNTER — Ambulatory Visit (HOSPITAL_COMMUNITY)
Admission: RE | Admit: 2015-09-09 | Discharge: 2015-09-09 | Disposition: A | Discharge status: Home / Self Care | Payer: 59 | Source: Ambulatory Visit | Attending: Cardiology | Admitting: Cardiology

## 2015-09-09 ENCOUNTER — Ambulatory Visit (HOSPITAL_COMMUNITY): Payer: 59 | Admitting: Anesthesiology

## 2015-09-09 ENCOUNTER — Encounter (HOSPITAL_COMMUNITY)
Admission: RE | Disposition: A | Discharge status: Home / Self Care | Payer: Self-pay | Source: Ambulatory Visit | Attending: Cardiology

## 2015-09-09 ENCOUNTER — Encounter (HOSPITAL_COMMUNITY): Payer: Self-pay

## 2015-09-09 DIAGNOSIS — E785 Hyperlipidemia, unspecified: Secondary | ICD-10-CM | POA: Diagnosis not present

## 2015-09-09 DIAGNOSIS — I4891 Unspecified atrial fibrillation: Secondary | ICD-10-CM | POA: Diagnosis not present

## 2015-09-09 DIAGNOSIS — Z7901 Long term (current) use of anticoagulants: Secondary | ICD-10-CM | POA: Insufficient documentation

## 2015-09-09 DIAGNOSIS — I5032 Chronic diastolic (congestive) heart failure: Secondary | ICD-10-CM | POA: Insufficient documentation

## 2015-09-09 DIAGNOSIS — I10 Essential (primary) hypertension: Secondary | ICD-10-CM | POA: Diagnosis not present

## 2015-09-09 DIAGNOSIS — G4733 Obstructive sleep apnea (adult) (pediatric): Secondary | ICD-10-CM | POA: Diagnosis not present

## 2015-09-09 DIAGNOSIS — Z87891 Personal history of nicotine dependence: Secondary | ICD-10-CM | POA: Diagnosis not present

## 2015-09-09 DIAGNOSIS — I481 Persistent atrial fibrillation: Secondary | ICD-10-CM | POA: Insufficient documentation

## 2015-09-09 HISTORY — PX: CARDIOVERSION: SHX1299

## 2015-09-09 LAB — POCT I-STAT 4, (NA,K, GLUC, HGB,HCT)
GLUCOSE: 103 mg/dL — AB (ref 65–99)
HEMATOCRIT: 46 % (ref 39.0–52.0)
Hemoglobin: 15.6 g/dL (ref 13.0–17.0)
POTASSIUM: 4.1 mmol/L (ref 3.5–5.1)
Sodium: 139 mmol/L (ref 135–145)

## 2015-09-09 SURGERY — CARDIOVERSION
Anesthesia: Monitor Anesthesia Care

## 2015-09-09 SURGERY — CARDIOVERSION
Anesthesia: General

## 2015-09-09 MED ORDER — LIDOCAINE HCL (CARDIAC) 20 MG/ML IV SOLN
INTRAVENOUS | Status: DC | PRN
  Administered 2015-09-09: 40 mg via INTRATRACHEAL
  Administered 2015-09-09: 50 mg via INTRATRACHEAL

## 2015-09-09 MED ORDER — PROPOFOL 10 MG/ML IV BOLUS
INTRAVENOUS | Status: DC | PRN
  Administered 2015-09-09: 40 mg via INTRAVENOUS
  Administered 2015-09-09 (×2): 20 mg via INTRAVENOUS
  Administered 2015-09-09: 40 mg via INTRAVENOUS
  Administered 2015-09-09: 80 mg via INTRAVENOUS

## 2015-09-09 MED ORDER — FLECAINIDE ACETATE 50 MG PO TABS
50.0000 mg | ORAL_TABLET | Freq: Once | ORAL | Status: AC
  Administered 2015-09-09: 50 mg via ORAL
  Filled 2015-09-09: qty 1

## 2015-09-09 MED ORDER — SODIUM CHLORIDE 0.9 % IV SOLN
INTRAVENOUS | Status: DC
  Administered 2015-09-09: 13:00:00 via INTRAVENOUS

## 2015-09-09 MED ORDER — FLECAINIDE ACETATE 50 MG PO TABS: 75.0000 mg | ORAL_TABLET | Freq: Two times a day (BID) | ORAL | Status: DC

## 2015-09-09 NOTE — Discharge Instructions (Signed)
Electrical Cardioversion, Care After °Refer to this sheet in the next few weeks. These instructions provide you with information on caring for yourself after your procedure. Your health care provider may also give you more specific instructions. Your treatment has been planned according to current medical practices, but problems sometimes occur. Call your health care provider if you have any problems or questions after your procedure. °WHAT TO EXPECT AFTER THE PROCEDURE °After your procedure, it is typical to have the following sensations: °· Some redness on the skin where the shocks were delivered. If this is tender, a sunburn lotion or hydrocortisone cream may help. °· Possible return of an abnormal heart rhythm within hours or days after the procedure. °HOME CARE INSTRUCTIONS °· Take medicines only as directed by your health care provider. Be sure you understand how and when to take your medicine. °· Learn how to feel your pulse and check it often. °· Limit your activity for 48 hours after the procedure or as directed by your health care provider. °· Avoid or minimize caffeine and other stimulants as directed by your health care provider. °SEEK MEDICAL CARE IF: °· You feel like your heart is beating too fast or your pulse is not regular. °· You have any questions about your medicines. °· You have bleeding that will not stop. °SEEK IMMEDIATE MEDICAL CARE IF: °· You are dizzy or feel faint. °· It is hard to breathe or you feel short of breath. °· There is a change in discomfort in your chest. °· Your speech is slurred or you have trouble moving an arm or leg on one side of your body. °· You get a serious muscle cramp that does not go away. °· Your fingers or toes turn cold or blue. °  °This information is not intended to replace advice given to you by your health care provider. Make sure you discuss any questions you have with your health care provider. °  °Document Released: 06/18/2013 Document Revised: 09/18/2014  Document Reviewed: 06/18/2013 °Elsevier Interactive Patient Education ©2016 Elsevier Inc. ° °

## 2015-09-09 NOTE — Procedures (Signed)
Electrical Cardioversion Procedure Note [place] Park [id number] 1951/08/24  Procedure: Electrical Cardioversion Indications:  Atrial Fibrillation.  Patient had a cardioversion earlier today and went back into atrial fibrillation. He was given a dose of flecainide and will undergo repeat cardioversion.   Procedure Details Consent: Risks of procedure as well as the alternatives and risks of each were explained to the (patient/caregiver).  Consent for procedure obtained. Time Out: Verified patient identification, verified procedure, site/side was marked, verified correct patient position, special equipment/implants available, medications/allergies/relevent history reviewed, required imaging and test results available.  Performed  Patient placed on cardiac monitor, pulse oximetry, supplemental oxygen as necessary.  Sedation given: Propofol per anesthesiology Pacer pads placed anterior and posterior chest.  Cardioverted 1 time(s).  Cardioverted at [place].  Evaluation Findings: Post procedure EKG shows: NSR Complications: None Patient did tolerate procedure well.   [place] Park 09/09/2015, 2:39 PM

## 2015-09-09 NOTE — Transfer of Care (Signed)
Immediate Anesthesia Transfer of Care Note  Patient: [place]  Procedure(s) Performed: Procedure(s): CARDIOVERSION (N/A)  Patient Location: PACU and Endoscopy Unit  Anesthesia Type:General  Level of Consciousness: awake, alert , oriented and patient cooperative  Airway & Oxygen Therapy: Patient Spontanous Breathing and Patient connected to nasal cannula oxygen  Post-op Assessment: Report given to RN, Post -op Vital signs reviewed and stable and Patient moving all extremities X 4  Post vital signs: Reviewed and stable  Last Vitals:  Filed Vitals:   09/09/15 1224 09/09/15 1225  BP:    Pulse: 51 60  Temp:    Resp: 17 13    Complications: No apparent anesthesia complications

## 2015-09-09 NOTE — Anesthesia Postprocedure Evaluation (Signed)
Anesthesia Post Note  Patient: [place] Park  Procedure(s) Performed: Procedure(s) (LRB): CARDIOVERSION (N/A)  Patient location during evaluation: PACU Anesthesia Type: MAC Level of consciousness: awake and alert Pain management: pain level controlled Vital Signs Assessment: post-procedure vital signs reviewed and stable Respiratory status: spontaneous breathing, nonlabored ventilation, respiratory function stable and patient connected to nasal cannula oxygen Cardiovascular status: blood pressure returned to baseline and stable Postop Assessment: no signs of nausea or vomiting Anesthetic complications: no    Last Vitals:  Filed Vitals:   09/09/15 1339 09/09/15 1445  BP: 96/59 99/71  Pulse: 65   Temp: 36.3 C   Resp: 18 28    Last Pain: There were no vitals filed for this visit.               Frutoso Dimare JENNETTE

## 2015-09-09 NOTE — Procedures (Signed)
Electrical Cardioversion Procedure Note [place] [id number] [birth year]  Procedure: Electrical Cardioversion Indications:  Atrial Fibrillation  Procedure Details Consent: Risks of procedure as well as the alternatives and risks of each were explained to the (patient/caregiver).  Consent for procedure obtained. Time Out: Verified patient identification, verified procedure, site/side was marked, verified correct patient position, special equipment/implants available, medications/allergies/relevent history reviewed, required imaging and test results available.  Performed  Patient placed on cardiac monitor, pulse oximetry, supplemental oxygen as necessary.  Sedation given: Propofol per anesthesiology Pacer pads placed anterior and posterior chest.  Cardioverted 1 time(s).  Cardioverted at [place].  Evaluation Findings: Post procedure EKG shows: NSR Complications: None Patient did tolerate procedure well.   [name] 09/09/2015, 1:25 PM

## 2015-09-09 NOTE — Anesthesia Preprocedure Evaluation (Signed)
Anesthesia Evaluation  Patient identified by MRN, date of birth, ID band Patient awake    Reviewed: Allergy & Precautions, NPO status , Patient's Chart, lab work & pertinent test results, reviewed documented beta blocker date and time   History of Anesthesia Complications Negative for: history of anesthetic complications  Airway Mallampati: I  TM Distance: >3 FB Neck ROM: Full    Dental  (+) Teeth Intact, Dental Advisory Given   Pulmonary shortness of breath and with exertion, sleep apnea and Continuous Positive Airway Pressure Ventilation , former smoker,    breath sounds clear to auscultation       Cardiovascular hypertension, + dysrhythmias Atrial Fibrillation  Rhythm:Irregular     Neuro/Psych Anxiety    GI/Hepatic   Endo/Other    Renal/GU Renal diseaseKidney Stones     Musculoskeletal   Abdominal (+) + obese,  Abdomen: soft. Bowel sounds: normal.  Peds  Hematology   Anesthesia Other Findings   Reproductive/Obstetrics                            EKG: 08/26/15 a/fib  BP Readings from Last 3 Encounters:  09/09/15 113/76  08/26/15 118/70  08/09/15 116/72   Lab Results  Component Value Date   WBC 10.2 08/04/2015   HGB 17.5* 08/04/2015   HCT 51.2 08/04/2015   MCV 91.1 08/04/2015   PLT 193 08/04/2015     Chemistry      Component Value Date/Time   NA 137 08/04/2015 0915   K 4.6 08/04/2015 0915   CL 105 08/04/2015 0915   CO2 24 08/04/2015 0915   BUN 16 08/04/2015 0915   CREATININE 1.05 08/04/2015 0915      Component Value Date/Time   CALCIUM 9.4 08/04/2015 0915   ALKPHOS 67 10/11/2010 0840   AST 24 10/11/2010 0840   ALT 33 10/11/2010 0840   BILITOT 0.7 10/11/2010 0840       Anesthesia Physical Anesthesia Plan  ASA: II  Anesthesia Plan: General   Post-op Pain Management:    Induction: Intravenous  Airway Management Planned: Mask and Natural Airway  Additional  Equipment:   Intra-op Plan:   Post-operative Plan:   Informed Consent: I have reviewed the patients History and Physical, chart, labs and discussed the procedure including the risks, benefits and alternatives for the proposed anesthesia with the patient or authorized representative who has indicated his/her understanding and acceptance.   Dental advisory given  Plan Discussed with: CRNA, Anesthesiologist and Surgeon  Anesthesia Plan Comments:         Anesthesia Quick Evaluation

## 2015-09-09 NOTE — H&P (View-Only) (Signed)
Patient ID: Austin Park, male   DOB: 1950-11-26, 64 y.o.   MRN: [id number]  PCP: Dr. Delfina Redwood  64 yo with history of HTN and paroxysmal atrial fibrillation presents for followup. Patient had a ruptured appendix in 10/11. Post-appendectomy, he developed atrial fibrillation. This was somewhat difficult to control and did not spontaneously convert, so TEE-guided cardioversion was done. He was on coumadin for about 6 wks after cardioversion. We then stopped it and put him on ASA as his CHADSVASC score was only 1.  He developed recurrent atrial fibrillation with RVR in 11/16.  DCCV was unsuccessful.  He has been started on flecainide and Cardiolite showed no ischemia or infarction.  He remains in NSR. Today.  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.  He is not noticing palpitations.  He does note new dyspnea with heavier exertion.  No dyspnea with usual ADLs.  No chest pain.  He is using CPAP regularly.    ECG: atrial fibrillation rate 83, QRS normal  PMH: 1. Hypertension 2. Acute appendicitis s/p appendectomy 10/11 (had rupture) 3. Atrial fibrillation: Post-op appendectdomy in 10/11, had TEE-guided DCCV.  Recurrent in 11/16.  Unsuccessful DCCV, started on flecainide.  4. Diastolic CHF in the setting of atrial fibrillation with RVR. Echo (10/11): EF 60-65%, mild LVH, mild to moderate LAE. Echo (12/16) with EF 55%, mild RV dilation with normal systolic function.  5. OSA on CPAP 6. Hyperlipidemia 7. Cardiolite (12/16): Normal study.   Family History: Mother with MI in her 101s, father with PCI at 26, father with AAA  Social History: The patient is married.  He teaches truck driving He has an occasional cigar here and there. He quit smoking cigarettes in 10/11 (1 pack/week prior) Occasional ETOH.   ROS: All systems reviewed and negative except as per HPI.    Current Outpatient Prescriptions  Medication Sig Dispense Refill  . acetaminophen (TYLENOL) 325 MG  tablet Take 325 mg by mouth every 6 (six) hours as needed for moderate pain.    Marland Kitchen apixaban (ELIQUIS) 5 MG TABS tablet Take 5 mg by mouth 2 (two) times daily.    Marland Kitchen diltiazem (CARDIZEM CD) 120 MG 24 hr capsule Take 2 tablets (240mg ) in the morning and 1 tablet (120mg ) in the evening 90 capsule 3  . flecainide (TAMBOCOR) 50 MG tablet Take 1 tablet (50 mg total) by mouth 2 (two) times daily. 60 tablet 3  . losartan (COZAAR) 50 MG tablet Take 25 mg by mouth daily.    . metoprolol succinate (TOPROL-XL) 50 MG 24 hr tablet Take 1 tablet (50 mg total) by mouth 2 (two) times daily. Take with or immediately following a meal. 60 tablet 6  . Multiple Vitamin (MULTI VITAMIN PO) Take 1 tablet by mouth daily.    . simvastatin (ZOCOR) 40 MG tablet Take 1 tablet (40 mg total) by mouth every evening. 30 tablet 3   No current facility-administered medications for this visit.   BP 118/70 mmHg  Pulse 83  Ht 6\' 3"  (1.905 m)  Wt 274 lb (124.286 kg)  BMI 34.25 kg/m2 General: NAD Neck: Thick, no JVD, no thyromegaly or thyroid nodule.  Lungs: Clear to auscultation bilaterally with normal respiratory effort. CV: Nondisplaced PMI.  Heart irregular S1/S2, no S3/S4, no murmur.  No peripheral edema.  No carotid bruit.  Normal pedal pulses.  Abdomen: Soft, nontender, no hepatosplenomegaly, no distention.  Skin: Intact without lesions or rashes.  Neurologic: Alert and oriented x 3.  Psych:  Normal affect. Extremities: No clubbing or cyanosis.  HEENT: Normal.   Assessment/Plan: 1. Atrial fibrillation: Persistent.  He is mildly symptomatic.  He failed DCCV without antiarrhythmics.  He has been on Eliquis and has now been started on flecainide 50 mg bid.  CHADSVASC = 1.  - Will plan for DCCV during the week between Christmas and New Years.  - Continue Eliquis, diltiazem CD, Toprol XL, and flecainide.  He had normal Cardiolite.  Will need ETT to assess for arrhythmias and QRS widening with exercise on flecainide.   -  Control risk factors: BP is ok, he is using CPAP.  Needs to limit ETOH.  2. OSA: Continue CPAP. 3. HTN; Controlled.   [name] 08/28/2015

## 2015-09-09 NOTE — OR Nursing (Signed)
Patient reverted back to AF after first cardioversion.  50 mg po fleccainide given  A second cardioversion was done after 30 minutes.  Patient converted to NSR

## 2015-09-09 NOTE — Interval H&P Note (Signed)
History and Physical Interval Note:  09/09/2015 1:12 PM  [place]  has presented today for surgery, with the diagnosis of AFIB  The various methods of treatment have been discussed with the patient and family. After consideration of risks, benefits and other options for treatment, the patient has consented to  Procedure(s): CARDIOVERSION (N/A) as a surgical intervention .  The patient's history has been reviewed, patient examined, no change in status, stable for surgery.  I have reviewed the patient's chart and labs.  Questions were answered to the patient's satisfaction.     Dalton [employer]

## 2015-09-09 NOTE — Anesthesia Postprocedure Evaluation (Signed)
Anesthesia Post Note  Patient: Austin Park  Procedure(s) Performed: Procedure(s) (LRB): CARDIOVERSION (N/A)  Patient location during evaluation: Endoscopy Anesthesia Type: MAC Level of consciousness: awake, oriented, awake and alert and patient cooperative Pain management: pain level controlled Vital Signs Assessment: vitals unstable Respiratory status: spontaneous breathing, respiratory function stable and patient connected to nasal cannula oxygen Cardiovascular status: blood pressure returned to baseline, stable and bradycardic Postop Assessment: no headache Anesthetic complications: no    Last Vitals:  Filed Vitals:   09/09/15 1339 09/09/15 1445  BP: 96/59 99/71  Pulse: 65   Temp: 36.3 C   Resp: 18 28    Last Pain: There were no vitals filed for this visit.               Quran Vasco

## 2015-09-09 NOTE — Transfer of Care (Signed)
Immediate Anesthesia Transfer of Care Note  Patient: [place] Park  Procedure(s) Performed: Procedure(s): CARDIOVERSION (N/A)  Patient Location: Endoscopy Unit  Anesthesia Type:MAC  Level of Consciousness: awake, alert , oriented and patient cooperative  Airway & Oxygen Therapy: Patient Spontanous Breathing and Patient connected to nasal cannula oxygen  Post-op Assessment: Report given to RN, Post -op Vital signs reviewed and stable and Patient moving all extremities  Post vital signs: Reviewed and stable  Last Vitals:  Filed Vitals:   09/09/15 1339 09/09/15 1445  BP: 96/59 99/71  Pulse: 65   Temp: 36.3 C   Resp: 18 28    Complications: No apparent anesthesia complications

## 2015-09-10 ENCOUNTER — Encounter (HOSPITAL_COMMUNITY): Payer: Self-pay | Admitting: Cardiology

## 2015-09-16 ENCOUNTER — Ambulatory Visit (HOSPITAL_COMMUNITY)
Admission: RE | Admit: 2015-09-16 | Discharge: 2015-09-16 | Disposition: A | Discharge status: Home / Self Care | Payer: 59 | Source: Ambulatory Visit | Attending: Nurse Practitioner | Admitting: Nurse Practitioner

## 2015-09-16 ENCOUNTER — Encounter (HOSPITAL_COMMUNITY): Payer: Self-pay | Admitting: Nurse Practitioner

## 2015-09-16 VITALS — HR 100 | Ht 75.0 in | Wt 273.4 lb

## 2015-09-16 DIAGNOSIS — E669 Obesity, unspecified: Secondary | ICD-10-CM | POA: Insufficient documentation

## 2015-09-16 DIAGNOSIS — G4730 Sleep apnea, unspecified: Secondary | ICD-10-CM | POA: Diagnosis not present

## 2015-09-16 DIAGNOSIS — I481 Persistent atrial fibrillation: Secondary | ICD-10-CM | POA: Diagnosis present

## 2015-09-16 DIAGNOSIS — I4819 Other persistent atrial fibrillation: Secondary | ICD-10-CM

## 2015-09-16 NOTE — Patient Instructions (Signed)
Your physician has recommended you make the following change in your medication:  1) Stop flecainide  

## 2015-09-16 NOTE — Addendum Note (Signed)
Addendum  created 09/16/15 0504 by Lauretta Grill, MD   Modules edited: Anesthesia Responsible Staff

## 2015-09-16 NOTE — Progress Notes (Signed)
Patient ID: Austin Park, male   DOB: [birth year], 65 y.o.   MRN: [id number]     Primary Care Physician: Austin Hams, MD Referring Physician: Dr. Arva Chafe Park is a 65 y.o. male with a h/o persistent afib that is for f/u in the afib clinic for f/u after second attempt at cardioversion with flecainide on board. Unfortunately, he had ERAF. He did feel much better in Sinus rhythm that he enjoyed for around 4 days. He since has returned to rate controlled afib. Flecainide was increased at time of second cardioversion to 75 mg bid, but he is having side effects of dizziness and visual changes since increase in drug.    Today, he denies symptoms of palpitations, chest pain, , orthopnea, PND, lower extremity edema, dizziness, presyncope, syncope, or neurologic sequela.  Positive for dizziness, blurred vision, shortness of breath with exertion.  Past Medical History  Diagnosis Date  . Anxiety   . Obstructive sleep apnea on CPAP   . Kidney stones   . Insomnia   . Tobacco abuse QUIT 2014  . HTN (hypertension)   . Overweight   . Atrial fibrillation ([place])   . Chronic diastolic CHF (congestive heart failure) ([place])   . Hypercholesterolemia   . Basal cell carcinoma of skin    Past Surgical History  Procedure Laterality Date  . Appendectomy    . Tonsillectomy    . Cardioversion N/A 08/09/2015    Procedure: CARDIOVERSION;  Surgeon: Austin Casino, MD;  Location: [facility] ENDOSCOPY;  Service: Cardiovascular;  Laterality: N/A;  . Cardioversion N/A 09/09/2015    Procedure: CARDIOVERSION;  Surgeon: Austin Dresser, MD;  Location: [facility];  Service: Cardiovascular;  Laterality: N/A;    Current Outpatient Prescriptions  Medication Sig Dispense Refill  . acetaminophen (TYLENOL) 325 MG tablet Take 325 mg by mouth every 6 (six) hours as needed for moderate pain.    Marland Kitchen apixaban (ELIQUIS) 5 MG TABS tablet Take 5 mg by mouth 2 (two) times daily.    Marland Kitchen diltiazem (CARDIZEM CD) 120 MG 24 hr  capsule Take 2 tablets (240mg ) in the morning and 1 tablet (120mg ) in the evening 90 capsule 3  . losartan (COZAAR) 50 MG tablet Take 25 mg by mouth daily.    . metoprolol succinate (TOPROL-XL) 50 MG 24 hr tablet Take 1 tablet (50 mg total) by mouth 2 (two) times daily. Take with or immediately following a meal. 60 tablet 6  . Multiple Vitamin (MULTI VITAMIN PO) Take 1 tablet by mouth daily.    . simvastatin (ZOCOR) 40 MG tablet Take 1 tablet (40 mg total) by mouth every evening. 30 tablet 3   No current facility-administered medications for this encounter.    No Known Allergies  Social History   Social History  . Marital Status: Married    Spouse Name: N/A  . Number of Children: N/A  . Years of Education: N/A   Occupational History  . Not on file.   Social History Main Topics  . Smoking status: Former Smoker -- 40 years    Types: Cigarettes    Quit date: 06/30/2013  . Smokeless tobacco: Not on file  . Alcohol Use: No  . Drug Use: No  . Sexual Activity: Not on file   Other Topics Concern  . Not on file   Social History Narrative    Family History  Problem Relation Age of Onset  . Hypertension Father   . Cancer Father  LYMPHOMA  . Hypertension Mother   . Coronary artery disease    . Hypercholesterolemia    . Hypertension Brother   . Hypertension Brother   . Hypertension Sister     ROS- All systems are reviewed and negative except as per the HPI above  Physical Exam: Filed Vitals:   09/16/15 0957  Pulse: 100  Height: 6\' 3"  (1.905 m)  Weight: 273 lb 6.4 oz (124.013 kg)    GEN- The patient is well appearing, alert and oriented x 3 today.   Head- normocephalic, atraumatic Eyes-  Sclera clear, conjunctiva pink Ears- hearing intact Oropharynx- clear Neck- supple, no JVP Lymph- no cervical lymphadenopathy Lungs- Clear to ausculation bilaterally, normal work of breathing Heart- Irregular rate and rhythm, no murmurs, rubs or gallops, PMI not laterally  displaced GI- soft, NT, ND, + BS Extremities- no clubbing, cyanosis, or edema MS- no significant deformity or atrophy Skin- no rash or lesion Psych- euthymic mood, full affect Neuro- strength and sensation are intact  EKG- afib at 100 bpm, qrs int 88 qtc 454 ms Epic records reviewed Echo-Left ventricle: The cavity size was normal. Wall thickness was increased in a pattern of mild LVH. The estimated ejection fraction was 55%. Although no diagnostic regional wall motion abnormality was identified, this possibility cannot be completely excluded on the basis of this study. Indeterminant diastolic function (atrial fibrillation). - Aortic valve: There was no stenosis. - Mitral valve: There was no significant regurgitation. - Left atrium: The atrium was mildly dilated. - Right ventricle: The cavity size was mildly dilated. Systolic function was normal. - Right atrium: The atrium was mildly dilated. - Tricuspid valve: There was mild-moderate regurgitation. Peak RV-RA gradient (S): 26 mm Hg. - Pulmonary arteries: PA peak pressure: 29 mm Hg (S). - Inferior vena cava: The vessel was normal in size. The respirophasic diameter changes were in the normal range (>= 50%), consistent with normal central venous pressure. Left atrium- 46 mm  Impressions:  - The patient was in atrial fibrillation. Normal LV size with EF 55%. Mildly dilated RV with normal systolic function. Biatrial enlargement.   Assessment and Plan: 1. Persistent afib Since he is not maintaining SR on flecainide, and having symptoms possibly related to drug, will stop drug. 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 Sleep apnea Continue cpap  3. Obesity Encouraged weight loss Weight class offered at [facility] No current alcohol use   I will set him up to discuss  ablation with Dr. Rayann Heman  afib clinic as needed  Geroge Baseman. Seanna Sisler, [facility] [address] [place], [place] [address] [phone]

## 2015-09-23 ENCOUNTER — Encounter: Payer: 59 | Admitting: Physician Assistant

## 2015-10-01 ENCOUNTER — Ambulatory Visit (INDEPENDENT_AMBULATORY_CARE_PROVIDER_SITE_OTHER): Payer: 59 | Admitting: Internal Medicine

## 2015-10-01 ENCOUNTER — Encounter: Payer: Self-pay | Admitting: Internal Medicine

## 2015-10-01 VITALS — BP 102/74 | HR 94 | Ht 75.0 in | Wt 272.8 lb

## 2015-10-01 DIAGNOSIS — Z9989 Dependence on other enabling machines and devices: Secondary | ICD-10-CM

## 2015-10-01 DIAGNOSIS — G4733 Obstructive sleep apnea (adult) (pediatric): Secondary | ICD-10-CM

## 2015-10-01 DIAGNOSIS — E663 Overweight: Secondary | ICD-10-CM | POA: Diagnosis not present

## 2015-10-01 DIAGNOSIS — I10 Essential (primary) hypertension: Secondary | ICD-10-CM | POA: Diagnosis not present

## 2015-10-01 DIAGNOSIS — I481 Persistent atrial fibrillation: Secondary | ICD-10-CM | POA: Diagnosis not present

## 2015-10-01 DIAGNOSIS — I4819 Other persistent atrial fibrillation: Secondary | ICD-10-CM

## 2015-10-01 MED ORDER — DILTIAZEM HCL ER COATED BEADS 120 MG PO CP24: ORAL_CAPSULE | ORAL | Status: DC

## 2015-10-01 NOTE — Progress Notes (Signed)
Electrophysiology Office Note   Date:  10/01/2015   ID:  FARHAN LEHN, DOB 16-Dec-1950, MRN [id number]  PCP:  [name], MD  Cardiologist:  Dr Aundra Dubin (referrign) Primary Electrophysiologist: Thompson Grayer, MD    Chief Complaint  Patient presents with  . Persistent AFIB     History of Present Illness: NIEKO HERSKOVITZ is a 65 y.o. male who presents today for electrophysiology evaluation.   The patient reports initially having afib 6 years ago after appendectomy.  He is unaware of any further afib until his routine office visit with Dr Delfina Redwood 10/16.  He was found to be in atrial fibrillation at that time.  He was referred to Dr Aundra Dubin.  He was started on eliquis and diltiazem.  He underwent cardioversion 08/09/15.  He maintained sinus rhythm for only about 10 minutes and then returned to afib.  He was placed on flecainide 50mg  BID and had repeat cardioversion 09/09/15.  He maintained sinus rhythm for less than a week.  He states that he is unaware of his afib and did not have any noticeable improvement with cardioversion.  His flecainide has been discontinued and he has done well since. He remains active.  With heavy lifting or exertion, he becomes winded.  He has SOB and diaphoresis with moderate activity.  He feels that he can do his usual work without difficulty.  Today, he denies symptoms of palpitations, chest pain,   orthopnea, PND, lower extremity edema, claudication, presyncope, syncope, bleeding, or neurologic sequela. The patient is tolerating medications without difficulties and is otherwise without complaint today.    Past Medical History  Diagnosis Date  . Anxiety   . Obstructive sleep apnea on CPAP     on CPAP  . Kidney stones   . Insomnia   . Tobacco abuse QUIT 2014  . HTN (hypertension)   . Overweight   . Persistent atrial fibrillation ([place])   . Chronic diastolic CHF (congestive heart failure) ([place])   . Hypercholesterolemia   . Basal cell carcinoma of skin     Past Surgical History  Procedure Laterality Date  . Appendectomy    . Tonsillectomy    . Cardioversion N/A 08/09/2015    Procedure: CARDIOVERSION;  Surgeon: [name], MD;  Location: [facility] ENDOSCOPY;  Service: Cardiovascular;  Laterality: N/A;  . Cardioversion N/A 09/09/2015    Procedure: CARDIOVERSION;  Surgeon: Larey Dresser, MD;  Location: [facility];  Service: Cardiovascular;  Laterality: N/A;     Current Outpatient Prescriptions  Medication Sig Dispense Refill  . acetaminophen (TYLENOL) 325 MG tablet Take 325 mg by mouth every 6 (six) hours as needed for moderate pain.    Marland Kitchen apixaban (ELIQUIS) 5 MG TABS tablet Take 5 mg by mouth 2 (two) times daily.    Marland Kitchen diltiazem (CARDIZEM CD) 120 MG 24 hr capsule Take 2 tablets (240mg ) in the morning and 1 tablet (120mg ) in the evening 90 capsule 3  . losartan (COZAAR) 50 MG tablet Take 25 mg by mouth daily.    . metoprolol succinate (TOPROL-XL) 50 MG 24 hr tablet Take 1 tablet (50 mg total) by mouth 2 (two) times daily. Take with or immediately following a meal. 60 tablet 6  . Multiple Vitamin (MULTI VITAMIN PO) Take 1 tablet by mouth daily.    . simvastatin (ZOCOR) 40 MG tablet Take 1 tablet (40 mg total) by mouth every evening. 30 tablet 3   No current facility-administered medications for this visit.    Allergies:  Review of patient's allergies indicates no known allergies.   Social History:  The patient  reports that he quit smoking about 2 years ago. His smoking use included Cigarettes. He quit after 40 years of use. He does not have any smokeless tobacco history on file. He reports that he does not drink alcohol or use illicit drugs.   Family History:  The patient's  family history includes Cancer in his father; Hypertension in his brother, brother, father, mother, and sister.    ROS:  Please see the history of present illness.   All other systems are reviewed and negative.    PHYSICAL EXAM: VS:  BP 102/74 mmHg  Pulse 94   Ht 6\' 3"  (1.905 m)  Wt 272 lb 12.8 oz (123.741 kg)  BMI 34.10 kg/m2 , BMI Body mass index is 34.1 kg/(m^2). GEN: overweight, in no acute distress HEENT: normal Neck: no JVD, carotid bruits, or masses Cardiac: iRRR; no murmurs, rubs, or gallops,no edema  Respiratory:  clear to auscultation bilaterally, normal work of breathing GI: soft, nontender, nondistended, + BS MS: no deformity or atrophy Skin: warm and dry  Neuro:  Strength and sensation are intact Psych: euthymic mood, full affect  EKG:  EKG is ordered today. The ekg ordered today shows afib, V rate 94 bpm, QTc 430 msec   Recent Labs: 08/04/2015: BUN 16; Creatinine, Ser 1.05; Platelets 193 09/09/2015: Hemoglobin 15.6; Potassium 4.1; Sodium 139    Lipid Panel     Component Value Date/Time   CHOL 185 10/11/2010 0840   TRIG 82.0 10/11/2010 0840   HDL 50.30 10/11/2010 0840   CHOLHDL 4 10/11/2010 0840   VLDL 16.4 10/11/2010 0840   LDLCALC 118* 10/11/2010 0840   LDLDIRECT 191.3 07/14/2010 0906     Wt Readings from Last 3 Encounters:  10/01/15 272 lb 12.8 oz (123.741 kg)  09/16/15 273 lb 6.4 oz (124.013 kg)  09/09/15 274 lb (124.286 kg)      Other studies Reviewed: Additional studies/ records that were reviewed today include: AF clinic notes, echo  Review of the above records today demonstrates: mild LA dilitation, preserved EF   ASSESSMENT AND PLAN:  1.  Persistent atrial fibrillation The patient has atrial fibrillation for which he has failed medical therapy with flecainide.  It is difficult to quantify his symptoms as he did not feel better after cardioversion.  I do think that our ability to achieve and maintain sinus rhythm is quite low.   Anticipated success with ablation is probably 50-60% with 1/3 change of requiring repeat procedures.  I think that the best approach currently is either rate control or an attempt at sinus with further rhythm assessment with tikosyn.  Risks of tikosyn were discussed with the  patient and he would like to proceed. We will therefore schedule hospitalization for initiation of tikosyn at the next available time.  2. Obesity Weight loss strongly advised  3. OSA Compliance with CPAP encouraged He says he uses CPAP diligently  4. HTN Stable No change required today  He says that he has not been working since being diagnosed with atrial fibrillation.  Currently, he appears to be able to perform modest activity without limitation.  I am not sure why he is out of work for afib.  From my standpoint, he is ok to return to work.  Any work related restrictions are deferred to Dr Aundra Dubin.   Current medicines are reviewed at length with the patient today.   The patient does not have concerns  regarding his medicines.  The following changes were made today:  none   Signed, Thompson Grayer, MD  10/01/2015 9:18 AM     [facility] [address] [place] [address] [phone] (office) [phone] (fax)

## 2015-10-01 NOTE — Patient Instructions (Addendum)
Medication Instructions:  Your physician recommends that you continue on your current medications as directed. Please refer to the Current Medication list given to you today.  Labwork: None ordered  Testing/Procedures: Your physician has recommended starting the medication Tikosyn.  Our pharmacist will contact you to arrange this hospital admission.  Follow-Up: To be determined once Tikosyn has been started.  If you need a refill on your cardiac medications before your next appointment, please call your pharmacy.  Thank you for choosing [facility]!!

## 2015-10-14 ENCOUNTER — Encounter: Payer: Self-pay | Admitting: Cardiology

## 2015-10-14 ENCOUNTER — Ambulatory Visit (INDEPENDENT_AMBULATORY_CARE_PROVIDER_SITE_OTHER): Payer: 59 | Admitting: Cardiology

## 2015-10-14 VITALS — BP 124/78 | HR 107 | Ht 75.0 in | Wt 266.1 lb

## 2015-10-14 DIAGNOSIS — I10 Essential (primary) hypertension: Secondary | ICD-10-CM

## 2015-10-14 DIAGNOSIS — I481 Persistent atrial fibrillation: Secondary | ICD-10-CM

## 2015-10-14 DIAGNOSIS — I4819 Other persistent atrial fibrillation: Secondary | ICD-10-CM

## 2015-10-14 MED ORDER — LOSARTAN POTASSIUM 50 MG PO TABS: 25.0000 mg | ORAL_TABLET | Freq: Every day | ORAL | Status: DC

## 2015-10-14 MED ORDER — METOPROLOL SUCCINATE ER 50 MG PO TB24: ORAL_TABLET | ORAL | Status: DC

## 2015-10-14 NOTE — Patient Instructions (Signed)
Medication Instructions:  Increase Toprol XL (metoprolol succinate) to 75mg  two times a day. This will be 1 and 1/2 ofa a 50mg  tablet two times a day.   Labwork: None today  Testing/Procedures: None today  Follow-Up: Your physician recommends that you schedule a follow-up appointment in: 6 weeks with Dr Aundra Dubin.          If you need a refill on your cardiac medications before your next appointment, please call your pharmacy.

## 2015-10-14 NOTE — Progress Notes (Signed)
Patient ID: Austin Park, male   DOB: 1951/02/05, 65 y.o.   MRN: [id number] PCP: Dr. Delfina Redwood  65 yo with history of HTN and atrial fibrillation presents for followup. Patient had a ruptured appendix in 10/11. Post-appendectomy, he developed atrial fibrillation. This was somewhat difficult to control and did not spontaneously convert, so TEE-guided cardioversion was done. He was on coumadin for about 6 wks after cardioversion. We then stopped it and put him on ASA as his CHADSVASC score was only 1.  He developed recurrent atrial fibrillation with RVR in 11/16.  DCCV was unsuccessful.  He was put on flecainide and went back into NSR but atrial fibrillation recurred.  Flecainide was increased to 75 mg bid and he had DCCV to NSR again in 12/16.  However, atrial fibrillation recurred.  He tolerated flecainide poorly due to dizziness and visual changes.  He was seen by Dr Rayann Heman and it was decided that rather than atrial fibrillation ablation, we would admit him for dofetilide initiation.  This is planned for next week.   He has been stable, remains in atrial fibrillation with mildly elevated rate.  He does not feel palpitations.  He does get more short of breath in atrial fibrillation => dyspnea with riding his exercise bike or walking up hills.  He does ok walking 1-1.5 miles on flat ground.    ECG: atrial fibrillation rate 107, QTc 427 msec  Labs (11/16): K 4.6, creatinine 1.05  PMH: 1. Hypertension 2. Acute appendicitis s/p appendectomy 10/11 (had rupture) 3. Atrial fibrillation: Post-op appendectdomy in 10/11, had TEE-guided DCCV.  Recurrent in 11/16.  Unsuccessful DCCV, started on flecainide.  DCCV again in 12/16 with resumption of NSR but went back into atrial fibrillation.   4. Diastolic CHF in the setting of atrial fibrillation with RVR. Echo (10/11): EF 60-65%, mild LVH, mild to moderate LAE. Echo (12/16) with EF 55%, mild RV dilation with normal systolic function.  5. OSA on CPAP 6.  Hyperlipidemia 7. Cardiolite (12/16): Normal study.   Family History: Mother with MI in her 65s, father with PCI at 15, father with AAA  Social History: The patient is married.  He teaches truck driving He has an occasional cigar here and there. He quit smoking cigarettes in 10/11 (1 pack/week prior) Occasional ETOH.   ROS: All systems reviewed and negative except as per HPI.    Current Outpatient Prescriptions  Medication Sig Dispense Refill  . acetaminophen (TYLENOL) 325 MG tablet Take 325 mg by mouth every 6 (six) hours as needed for moderate pain.    Marland Kitchen apixaban (ELIQUIS) 5 MG TABS tablet Take 5 mg by mouth 2 (two) times daily.    Marland Kitchen diltiazem (CARDIZEM CD) 120 MG 24 hr capsule TAKE 2 TABLETS (240 MG) BY MOUTH IN THE MORNING AND 1 TABLET (120 MG)  IN THE EVENING    . losartan (COZAAR) 50 MG tablet Take 0.5 tablets (25 mg total) by mouth daily. 45 tablet 3  . metoprolol succinate (TOPROL-XL) 50 MG 24 hr tablet 1.5 tablets (75mg ) two times a day. Take with or immediately following a meal. 270 tablet 0  . Multiple Vitamin (MULTI VITAMIN PO) Take 1 tablet by mouth daily.    . simvastatin (ZOCOR) 40 MG tablet Take 1 tablet (40 mg total) by mouth every evening. 30 tablet 3   No current facility-administered medications for this visit.   BP 124/78 mmHg  Pulse 107  Ht 6\' 3"  (1.905 m)  Wt 266 lb 1.9 oz (120.711  kg)  BMI 33.26 kg/m2 General: NAD Neck: Thick, no JVD, no thyromegaly or thyroid nodule.  Lungs: Clear to auscultation bilaterally with normal respiratory effort. CV: Nondisplaced PMI.  Heart mildly tachy, irregular S1/S2, no S3/S4, no murmur.  No peripheral edema.  No carotid bruit.  Normal pedal pulses.  Abdomen: Soft, nontender, no hepatosplenomegaly, no distention.  Skin: Intact without lesions or rashes.  Neurologic: Alert and oriented x 3.  Psych: Normal affect. Extremities: No clubbing or cyanosis.  HEENT: Normal.   Assessment/Plan: 1. Atrial fibrillation:  Persistent.  He is mildly symptomatic.  He failed DCCV on flecainide (had side effects also with flecainide).  He has been on Eliquis without missing any doses.  CHADSVASC = 1 (HTN) - Admit next week for dofetilide initiation. QTc ok on ECG today. He will get BMET and Mg as well as repeat ECG on day of admission.  - Continue Eliquis, diltiazem CD.  I will increase Toprol XL to 75 mg bid.    - Control risk factors: BP is ok, he is using CPAP.  Needs to limit ETOH. Weight loss would be helpful. 2. OSA: Continue CPAP. 3. HTN: Controlled.   [name] 10/14/2015

## 2015-10-25 ENCOUNTER — Ambulatory Visit (INDEPENDENT_AMBULATORY_CARE_PROVIDER_SITE_OTHER): Payer: 59 | Admitting: Pharmacist

## 2015-10-25 ENCOUNTER — Inpatient Hospital Stay (HOSPITAL_COMMUNITY)
Admission: AD | Admit: 2015-10-25 | Discharge: 2015-10-28 | DRG: 309 | Disposition: A | Discharge status: Home / Self Care | Payer: 59 | Source: Ambulatory Visit | Attending: Internal Medicine | Admitting: Internal Medicine

## 2015-10-25 DIAGNOSIS — I4819 Other persistent atrial fibrillation: Secondary | ICD-10-CM

## 2015-10-25 DIAGNOSIS — Z85828 Personal history of other malignant neoplasm of skin: Secondary | ICD-10-CM

## 2015-10-25 DIAGNOSIS — I4891 Unspecified atrial fibrillation: Secondary | ICD-10-CM | POA: Diagnosis present

## 2015-10-25 DIAGNOSIS — G4733 Obstructive sleep apnea (adult) (pediatric): Secondary | ICD-10-CM | POA: Diagnosis not present

## 2015-10-25 DIAGNOSIS — I5032 Chronic diastolic (congestive) heart failure: Secondary | ICD-10-CM | POA: Diagnosis present

## 2015-10-25 DIAGNOSIS — I480 Paroxysmal atrial fibrillation: Principal | ICD-10-CM | POA: Diagnosis present

## 2015-10-25 DIAGNOSIS — Z6833 Body mass index (BMI) 33.0-33.9, adult: Secondary | ICD-10-CM

## 2015-10-25 DIAGNOSIS — E7800 Pure hypercholesterolemia, unspecified: Secondary | ICD-10-CM | POA: Diagnosis present

## 2015-10-25 DIAGNOSIS — I5030 Unspecified diastolic (congestive) heart failure: Secondary | ICD-10-CM | POA: Diagnosis not present

## 2015-10-25 DIAGNOSIS — I110 Hypertensive heart disease with heart failure: Secondary | ICD-10-CM | POA: Diagnosis present

## 2015-10-25 DIAGNOSIS — Z7901 Long term (current) use of anticoagulants: Secondary | ICD-10-CM

## 2015-10-25 DIAGNOSIS — E669 Obesity, unspecified: Secondary | ICD-10-CM | POA: Diagnosis present

## 2015-10-25 DIAGNOSIS — I481 Persistent atrial fibrillation: Secondary | ICD-10-CM | POA: Diagnosis not present

## 2015-10-25 DIAGNOSIS — R001 Bradycardia, unspecified: Secondary | ICD-10-CM | POA: Diagnosis not present

## 2015-10-25 DIAGNOSIS — Z5181 Encounter for therapeutic drug level monitoring: Secondary | ICD-10-CM

## 2015-10-25 DIAGNOSIS — Z87891 Personal history of nicotine dependence: Secondary | ICD-10-CM

## 2015-10-25 DIAGNOSIS — I10 Essential (primary) hypertension: Secondary | ICD-10-CM | POA: Diagnosis not present

## 2015-10-25 DIAGNOSIS — Z79899 Other long term (current) drug therapy: Secondary | ICD-10-CM

## 2015-10-25 LAB — BASIC METABOLIC PANEL
BUN: 15 mg/dL (ref 7–25)
CALCIUM: 9.2 mg/dL (ref 8.6–10.3)
CO2: 26 mmol/L (ref 20–31)
Chloride: 104 mmol/L (ref 98–110)
Creat: 1.24 mg/dL (ref 0.70–1.25)
Glucose, Bld: 101 mg/dL — ABNORMAL HIGH (ref 65–99)
POTASSIUM: 4.5 mmol/L (ref 3.5–5.3)
SODIUM: 139 mmol/L (ref 135–146)

## 2015-10-25 LAB — MAGNESIUM: Magnesium: 2.2 mg/dL (ref 1.5–2.5)

## 2015-10-25 MED ORDER — LOSARTAN POTASSIUM 25 MG PO TABS
25.0000 mg | ORAL_TABLET | Freq: Every day | ORAL | Status: DC
  Administered 2015-10-26 – 2015-10-28 (×3): 25 mg via ORAL
  Filled 2015-10-25 (×3): qty 1

## 2015-10-25 MED ORDER — SODIUM CHLORIDE 0.9% FLUSH
3.0000 mL | Freq: Two times a day (BID) | INTRAVENOUS | Status: DC
  Administered 2015-10-25 – 2015-10-27 (×5): 3 mL via INTRAVENOUS

## 2015-10-25 MED ORDER — SIMVASTATIN 40 MG PO TABS
40.0000 mg | ORAL_TABLET | Freq: Every evening | ORAL | Status: DC
  Filled 2015-10-25: qty 1

## 2015-10-25 MED ORDER — DOFETILIDE 250 MCG PO CAPS
500.0000 ug | ORAL_CAPSULE | Freq: Two times a day (BID) | ORAL | Status: DC
  Administered 2015-10-25 – 2015-10-28 (×6): 500 ug via ORAL
  Filled 2015-10-25 (×6): qty 2

## 2015-10-25 MED ORDER — SODIUM CHLORIDE 0.9 % IV SOLN: 250.0000 mL | INTRAVENOUS | Status: DC | PRN

## 2015-10-25 MED ORDER — DILTIAZEM HCL ER COATED BEADS 120 MG PO CP24
120.0000 mg | ORAL_CAPSULE | Freq: Every day | ORAL | Status: DC
  Administered 2015-10-25 – 2015-10-26 (×2): 120 mg via ORAL
  Filled 2015-10-25 (×2): qty 1

## 2015-10-25 MED ORDER — METOPROLOL SUCCINATE ER 50 MG PO TB24
75.0000 mg | ORAL_TABLET | Freq: Two times a day (BID) | ORAL | Status: DC
  Administered 2015-10-25 – 2015-10-26 (×3): 75 mg via ORAL
  Filled 2015-10-25 (×6): qty 1

## 2015-10-25 MED ORDER — ACETAMINOPHEN 325 MG PO TABS: 325.0000 mg | ORAL_TABLET | Freq: Four times a day (QID) | ORAL | Status: DC | PRN

## 2015-10-25 MED ORDER — SODIUM CHLORIDE 0.9% FLUSH: 3.0000 mL | INTRAVENOUS | Status: DC | PRN

## 2015-10-25 MED ORDER — DILTIAZEM HCL ER COATED BEADS 240 MG PO CP24
240.0000 mg | ORAL_CAPSULE | Freq: Every day | ORAL | Status: DC
  Administered 2015-10-26: 240 mg via ORAL
  Filled 2015-10-25: qty 1

## 2015-10-25 MED ORDER — APIXABAN 5 MG PO TABS
5.0000 mg | ORAL_TABLET | Freq: Two times a day (BID) | ORAL | Status: DC
  Administered 2015-10-25 – 2015-10-28 (×6): 5 mg via ORAL
  Filled 2015-10-25 (×6): qty 1

## 2015-10-25 NOTE — Progress Notes (Signed)
Pharmacy Review for Dofetilide (Tikosyn) Initiation  Admit Complaint: 65 y.o. male admitted 10/25/2015 with atrial fibrillation to be initiated on dofetilide.   Assessment:  Patient Exclusion Criteria: If any screening criteria checked as "Yes", then  patient  should NOT receive dofetilide until criteria item is corrected. If "Yes" please indicate correction plan.  YES  NO Patient  Exclusion Criteria Correction Plan  []  [x]  Baseline QTc interval is greater than or equal to 440 msec. IF above YES box checked dofetilide contraindicated unless patient has ICD; then may proceed if QTc 500-550 msec or with known ventricular conduction abnormalities may proceed with QTc 550-600 msec. QTc =     []  [x]  Magnesium level is less than 1.8 mEq/l : Last magnesium:  Lab Results  Component Value Date   MG 2.2 10/25/2015         []  [x]  Potassium level is less than 4 mEq/l : Last potassium:  Lab Results  Component Value Date   K 4.5 10/25/2015         []  [x]  Patient is known or suspected to have a digoxin level greater than 2 ng/ml: No results found for: DIGOXIN    []  [x]  Creatinine clearance less than 20 ml/min (calculated using Cockcroft-Gault, actual body weight and serum creatinine): Estimated Creatinine Clearance: 85.4 mL/min (by C-G formula based on Cr of 1.24).    []  [x]  Patient has received drugs known to prolong the QT intervals within the last 48 hours (phenothiazines, tricyclics or tetracyclic antidepressants, erythromycin, H-1 antihistamines, cisapride, fluoroquinolones, azithromycin). Drugs not listed above may have an, as yet, undetected potential to prolong the QT interval, updated information on QT prolonging agents is available at this website:QT prolonging agents   []  [x]  Patient received a dose of hydrochlorothiazide (Oretic) alone or in any combination including triamterene (Dyazide, Maxzide) in the last 48 hours.   []  [x]  Patient received a medication known to increase dofetilide  plasma concentrations prior to initial dofetilide dose:  . Trimethoprim (Primsol, Proloprim) in the last 36 hours . Verapamil (Calan, Verelan) in the last 36 hours or a sustained release dose in the last 72 hours . Megestrol (Megace) in the last 5 days  . Cimetidine (Tagamet) in the last 6 hours . Ketoconazole (Nizoral) in the last 24 hours . Itraconazole (Sporanox) in the last 48 hours  . Prochlorperazine (Compazine) in the last 36 hours    []  [x]  Patient is known to have a history of torsades de pointes; congenital or acquired long QT syndromes.   []  [x]  Patient has received a Class 1 antiarrhythmic with less than 2 half-lives since last dose. (Disopyramide, Quinidine, Procainamide, Lidocaine, Mexiletine, Flecainide, Propafenone)   []  [x]  Patient has received amiodarone therapy in the past 3 months or amiodarone level is greater than 0.3 ng/ml.    Patient has been appropriately anticoagulated with apixaban 5mg  BID.    Ordering provider was confirmed at [email or web address] if they are not listed on the [place] Prescribers list.  Goal of Therapy: Follow renal function, electrolytes, potential drug interactions, and dose adjustment. Provide education and 1 week supply at discharge.  Plan:  [x]   Physician selected initial dose within range recommended for patients level of renal function - will monitor for response.  []   Physician selected initial dose outside of range recommended for patients level of renal function - will discuss if the dose should be altered at this time.   Select One Calculated CrCl  Dose q12h  [x]  > 60 ml/min  500 mcg  []  40-60 ml/min 250 mcg  []  20-40 ml/min 125 mcg   2. Follow up QTc after the first 5 doses, renal function, electrolytes (K & Mg) daily x 3     days, dose adjustment, success of initiation and facilitate 1 week discharge supply as     clinically indicated.  3. Initiate Tikosyn education video (Call [phone] and ask for video # [phone]).  4.  Place Enrollment Form on the chart for discharge supply of dofetilide.   Bonnita Nasuti Pharm.D. CPP, BCPS Clinical Pharmacist [phone] 10/25/2015 4:54 PM

## 2015-10-25 NOTE — H&P (Signed)
History and Physical   Patient ID: Austin Park, MRN: [id number], DOB: 11/16/1950   Date of Encounter: 10/25/2015, 4:37 PM  Primary Care Provider: [name], MD Cardiologist DM Electrophysiologist:  Greggory Brandy  Chief Complaint:  AFIB  History of Present Illness: Austin Park is a 65 y.o. male admitted for dofetilide.  His hx of PAF originaly 10/11 following appendctomy   He had recurrent atrial fibrillation 11/16 and underwent cardioversion. This was complicated by ERAF prompting the initiation of flecainide 50 twice a day with repeat cardioversion. ERAF recurred and the doses uptitrated, but he was poorly tolerating the flecainide.  Notably, symptoms were not appreciably better in sinus. It was thus elected, after consultation with Dr. Greggory Brandy, to pursue dofetilide therapy juncture rather than proceeding with catheter ablation.  Retrospectively, he appreciated that he was much better in the summer prior to the fall when atrial fibrillation recurred.  Left ventricular function was normal. Left atrial size discordant measurements (46/1.8/+ or  -30)  Significant adjunctive issues include treated sleep apnea hypertension and morbid obesity.   Past Medical History  Diagnosis Date  . Anxiety   . Obstructive sleep apnea on CPAP     on CPAP  . Kidney stones   . Insomnia   . Tobacco abuse QUIT 2014  . HTN (hypertension)   . Overweight   . Persistent atrial fibrillation ([place])   . Chronic diastolic CHF (congestive heart failure) ([place])   . Hypercholesterolemia   . Basal cell carcinoma of skin      Past Surgical History  Procedure Laterality Date  . Appendectomy    . Tonsillectomy    . Cardioversion N/A 08/09/2015    Procedure: CARDIOVERSION;  Surgeon: [name], MD;  Location: [facility] ENDOSCOPY;  Service: Cardiovascular;  Laterality: N/A;  . Cardioversion N/A 09/09/2015    Procedure: CARDIOVERSION;  Surgeon: Larey Dresser, MD;  Location: [facility];  Service: Cardiovascular;   Laterality: N/A;      Prior to Admission medications   Medication Sig Start Date End Date Taking? Authorizing Provider  acetaminophen (TYLENOL) 325 MG tablet Take 325 mg by mouth every 6 (six) hours as needed for moderate pain.    Historical Provider, MD  apixaban (ELIQUIS) 5 MG TABS tablet Take 5 mg by mouth 2 (two) times daily. 06/28/15   Historical Provider, MD  diltiazem (CARDIZEM CD) 120 MG 24 hr capsule TAKE 2 TABLETS (240 MG) BY MOUTH IN THE MORNING AND 1 TABLET (120 MG)  IN THE EVENING    Historical Provider, MD  losartan (COZAAR) 50 MG tablet Take 0.5 tablets (25 mg total) by mouth daily. 10/14/15   Larey Dresser, MD  metoprolol succinate (TOPROL-XL) 50 MG 24 hr tablet 1.5 tablets (75mg ) two times a day. Take with or immediately following a meal. 10/14/15   Larey Dresser, MD  Multiple Vitamin (MULTI VITAMIN PO) Take 1 tablet by mouth daily.    Historical Provider, MD  simvastatin (ZOCOR) 40 MG tablet Take 1 tablet (40 mg total) by mouth every evening. 06/27/11 10/14/15  Larey Dresser, MD      Allergies: No Known Allergies   Social History:  The patient  reports that he quit smoking about 2 years ago. His smoking use included Cigarettes. He quit after 40 years of use. He does not have any smokeless tobacco history on file. He reports that he does not drink alcohol or use illicit drugs.   Family History:  The patient's family history includes Cancer in  his father; Hypertension in his brother, brother, father, mother, and sister.   ROS:  Please see the history of present illness.     All other systems reviewed and negative.   Vital Signs: Blood pressure 123/74, pulse 105, SpO2 94 %.  PHYSICAL EXAM: General:  Well nourished morbidly obese age appearing 10 male , in no acute distress  HEENT: normal Lymph: no adenopathy Neck: no JVD Endocrine:  No thryomegaly Vascular: No carotid bruits; FA pulses 2+ bilaterally without bruits Cardiac:  Irregularly irregular rate and  rhythm Lungs:  clear to auscultation bilaterally, no wheezing, rhonchi or rales Abd: soft, nontender, no hepatomegaly Ext: no edema Musculoskeletal:  No deformities, BUE and BLE strength normal and equal Skin: warm and dry Neuro:  CNs 2-12 intact, no focal abnormalities noted Psych:  Normal affect    EKG:   afib 107 QTc 427 Labs:   Lab Results  Component Value Date   WBC 10.2 08/04/2015   HGB 15.6 09/09/2015   HCT 46.0 09/09/2015   MCV 91.1 08/04/2015   PLT 193 08/04/2015      Recent Labs Lab 10/25/15 1032  NA 139  K 4.5  CL 104  CO2 26  BUN 15  CREATININE 1.24  CALCIUM 9.2  GLUCOSE 101*    No results for input(s): CKTOTAL, CKMB, TROPONINI in the last 72 hours.  Lab Results  Component Value Date   CHOL 185 10/11/2010   HDL 50.30 10/11/2010   LDLCALC 118* 10/11/2010   TRIG 82.0 10/11/2010    No results found for: DDIMER   Radiology/Studies:   No results found.    ASSESSMENT AND PLAN:   1. Atrial fibrillation-persistent 2. HTN 3. Morbid obesity 4. OSA on cpap     Patient is admitted for dofetilide loading;  based on his GFR (85 or so) 500 g twice a day as appropriate. He has been taking dofetilide. We will continue him on his current medications.   On his reflections, atrial fibrillation over the fall has clearly been associated with worsening exercise tolerance and more diaphoresis.  We will anticipate cardioversion on Wednesday if he fails to convert before then.  Drenda Freeze 10/25/2015 4:37 PM  Pager # [phone]

## 2015-10-25 NOTE — Progress Notes (Signed)
Patient ID: Austin Park                 DOB: 1950-10-13, 65 yo                         MRN: [id number]     HPI: Austin Park is a 65 y.o. male patient of Dr. Aundra Dubin, also seen by Dr. Rayann Heman and Roderic Palau in [facility], who presents today with his wife for Tikosyn initiation. PMH is significant for initial afib 6 years ago after appendectomy. He was unaware of any further afib until his routine office visit with Dr Delfina Redwood 10/16. He was found to be in atrial fibrillation at that time. He was referred to Dr Aundra Dubin. He was started on eliquis and diltiazem. He underwent cardioversion 08/09/15. He maintained sinus rhythm for only about 10 minutes and then returned to afib. He was placed on flecainide 50mg  BID which he failed and had repeat cardioversion 09/09/15. He maintained sinus rhythm for less than a week. The decision was made at [facility] with Dr. Rayann Heman on 10/01/15 to pursue Tikosyn initiation.   Patient presents today to clinic with his wife. We reviewed potential side effects of Tikosyn including QTc prolongation.He is aware of the importance of not missing doses and will call the office if he misses more than 2 doses in a row. He is aware to inform all of his other providers about Tikosyn and to call the office if there is ever a question about taking a new medication. Reviewed patient's medication list - he is not on any QTc prolonging drugs. He is anticoagulated on Eliquis and reports that he has not missed a dose in the last 30 days.  EKG reviewed by Dr. Rayann Heman. Atrial fibrillation with vent rate 81 bpm, QTc 469msec.  Labs: K 4.5, Mg 2.2, SCr 1.24, Wt 268 lbs, CrCl >100 mL/min  Past Medical History  Diagnosis Date  . Anxiety   . Obstructive sleep apnea on CPAP     on CPAP  . Kidney stones   . Insomnia   . Tobacco abuse QUIT 2014  . HTN (hypertension)   . Overweight   . Persistent atrial fibrillation ([place])   . Chronic diastolic CHF (congestive heart failure) ([place])   .  Hypercholesterolemia   . Basal cell carcinoma of skin     Current Outpatient Prescriptions on File Prior to Visit  Medication Sig Dispense Refill  . acetaminophen (TYLENOL) 325 MG tablet Take 325 mg by mouth every 6 (six) hours as needed for moderate pain.    Marland Kitchen apixaban (ELIQUIS) 5 MG TABS tablet Take 5 mg by mouth 2 (two) times daily.    Marland Kitchen diltiazem (CARDIZEM CD) 120 MG 24 hr capsule TAKE 2 TABLETS (240 MG) BY MOUTH IN THE MORNING AND 1 TABLET (120 MG)  IN THE EVENING    . losartan (COZAAR) 50 MG tablet Take 0.5 tablets (25 mg total) by mouth daily. 45 tablet 3  . metoprolol succinate (TOPROL-XL) 50 MG 24 hr tablet 1.5 tablets (75mg ) two times a day. Take with or immediately following a meal. 270 tablet 0  . Multiple Vitamin (MULTI VITAMIN PO) Take 1 tablet by mouth daily.    . simvastatin (ZOCOR) 40 MG tablet Take 1 tablet (40 mg total) by mouth every evening. 30 tablet 3   No current facility-administered medications on file prior to visit.    No Known Allergies   Assessment/Plan:  1. Atrial  fibrillation - EKG reviewed by Dr. Rayann Heman. Labs stable for Tikosyn initiation. CrCl >100, anticipated starting dose of Tikosyn is 524mcg BID. Patient aware to report to admitting.    Megan E. Supple, PharmD, [place] [id number] [place], [place] [address] Phone: [phone]; Fax: [phone] 10/25/2015 11:06 AM

## 2015-10-25 NOTE — Progress Notes (Signed)
Pt has arrived to 2W. Pt is in no distress. Pt has been oriented to room and telemetry box has been applied. MD will be in shortly.  Grant Fontana RN, BSN

## 2015-10-26 DIAGNOSIS — I10 Essential (primary) hypertension: Secondary | ICD-10-CM

## 2015-10-26 DIAGNOSIS — G4733 Obstructive sleep apnea (adult) (pediatric): Secondary | ICD-10-CM

## 2015-10-26 LAB — HEPATIC FUNCTION PANEL
ALK PHOS: 61 U/L (ref 38–126)
ALT: 20 U/L (ref 17–63)
AST: 16 U/L (ref 15–41)
Albumin: 3.1 g/dL — ABNORMAL LOW (ref 3.5–5.0)
BILIRUBIN DIRECT: 0.1 mg/dL (ref 0.1–0.5)
BILIRUBIN INDIRECT: 0.5 mg/dL (ref 0.3–0.9)
BILIRUBIN TOTAL: 0.6 mg/dL (ref 0.3–1.2)
Total Protein: 5.5 g/dL — ABNORMAL LOW (ref 6.5–8.1)

## 2015-10-26 LAB — BASIC METABOLIC PANEL
Anion gap: 11 (ref 5–15)
BUN: 15 mg/dL (ref 6–20)
CALCIUM: 8.9 mg/dL (ref 8.9–10.3)
CO2: 24 mmol/L (ref 22–32)
CREATININE: 0.95 mg/dL (ref 0.61–1.24)
Chloride: 105 mmol/L (ref 101–111)
Glucose, Bld: 104 mg/dL — ABNORMAL HIGH (ref 65–99)
Potassium: 3.8 mmol/L (ref 3.5–5.1)
SODIUM: 140 mmol/L (ref 135–145)

## 2015-10-26 LAB — MAGNESIUM: MAGNESIUM: 2 mg/dL (ref 1.7–2.4)

## 2015-10-26 MED ORDER — POTASSIUM CHLORIDE CRYS ER 20 MEQ PO TBCR
40.0000 meq | EXTENDED_RELEASE_TABLET | Freq: Once | ORAL | Status: AC
  Administered 2015-10-26: 40 meq via ORAL
  Filled 2015-10-26: qty 2

## 2015-10-26 MED ORDER — ATORVASTATIN CALCIUM 20 MG PO TABS
20.0000 mg | ORAL_TABLET | Freq: Every day | ORAL | Status: DC
  Administered 2015-10-26 – 2015-10-27 (×2): 20 mg via ORAL
  Filled 2015-10-26 (×2): qty 1

## 2015-10-26 NOTE — Progress Notes (Signed)
Utilization review completed. Zacchary Pompei, RN, BSN. 

## 2015-10-26 NOTE — Progress Notes (Signed)
    SUBJECTIVE: The patient continues to do well today.  At this time, he denies chest pain, shortness of breath, or any new concerns.  Marland Kitchen apixaban  5 mg Oral BID  . diltiazem  120 mg Oral Daily  . diltiazem  240 mg Oral Daily  . dofetilide  500 mcg Oral BID  . losartan  25 mg Oral Daily  . metoprolol succinate  75 mg Oral BID  . potassium chloride  40 mEq Oral Once  . simvastatin  40 mg Oral QPM  . sodium chloride flush  3 mL Intravenous Q12H      OBJECTIVE: Physical Exam: Filed Vitals:   10/25/15 1614 10/25/15 1646 10/25/15 2000 10/26/15 0542  BP: 123/74  110/78 105/67  Pulse: 105  83 60  Temp:   98.5 F (36.9 C)   TempSrc:   Oral   Resp:   18 16  Height:  6\' 3"  (1.905 m)    Weight:  264 lb 9.6 oz (120.022 kg)    SpO2: 94%  96% 95%   No intake or output data in the 24 hours ending 10/26/15 [id number]  Telemetry reveals SR/SB 50's-60's  GEN- The patient is well appearing, alert and oriented x 3 today.   Head- normocephalic, atraumatic Eyes-  Sclera clear, conjunctiva pink Ears- hearing intact Oropharynx- clear Neck- supple, no JVP Lungs- Clear to ausculation bilaterally, normal work of breathing Heart- Regular rate and rhythm, no significant murmurs, no rubs or gallops GI- soft, NT, ND Extremities- no clubbing, cyanosis, or edema Skin- no rash or lesion Psych- euthymic mood, full affect Neuro- no gross deficits appreciated  LABS: Basic Metabolic Panel:  Recent Labs  10/25/15 1032 10/26/15 0240  NA 139 140  K 4.5 3.8  CL 104 105  CO2 26 24  GLUCOSE 101* 104*  BUN 15 15  CREATININE 1.24 0.95  CALCIUM 9.2 8.9  MG 2.2 2.0   Liver Function Tests:  Recent Labs  10/26/15 0240  AST 16  ALT 20  ALKPHOS 61  BILITOT 0.6  PROT 5.5*  ALBUMIN 3.1*     ASSESSMENT AND PLAN:  Active Problems:   Visit for monitoring Tikosyn therapy  1. Persistant Afib     Tikosyn load, s/p 3 doses      SR/SB 50-60's      QTC is stable      K+ is 4.3      Mag 2.2  Creat 0.94  2. HTN     Appears well controlled  Tommye Standard, PA-C 10/26/2015 8:07 AM  Thompson Grayer MD, [facility] 10/28/2015 3:15 PM

## 2015-10-26 NOTE — Progress Notes (Addendum)
   SUBJECTIVE: The patient is doing well today.  At this time, he denies chest pain, shortness of breath, or any new concerns.  Marland Kitchen apixaban  5 mg Oral BID  . diltiazem  120 mg Oral Daily  . diltiazem  240 mg Oral Daily  . dofetilide  500 mcg Oral BID  . losartan  25 mg Oral Daily  . metoprolol succinate  75 mg Oral BID  . potassium chloride  40 mEq Oral Once  . simvastatin  40 mg Oral QPM  . sodium chloride flush  3 mL Intravenous Q12H      OBJECTIVE: Physical Exam: Filed Vitals:   10/25/15 1614 10/25/15 1646 10/25/15 2000 10/26/15 0542  BP: 123/74  110/78 105/67  Pulse: 105  83 60  Temp:   98.5 F (36.9 C)   TempSrc:   Oral   Resp:   18 16  Height:  6\' 3"  (1.905 m)    Weight:  264 lb 9.6 oz (120.022 kg)    SpO2: 94%  96% 95%   No intake or output data in the 24 hours ending 10/26/15 0809  Telemetry reveals afib converted to sinus rhythm overnight, no ventricular arrhythmias  GEN- The patient is well appearing, alert and oriented x 3 today.   Head- normocephalic, atraumatic Eyes-  Sclera clear, conjunctiva pink Ears- hearing intact Oropharynx- clear Neck- supple, no JVP Lymph- no cervical lymphadenopathy Lungs- Clear to ausculation bilaterally, normal work of breathing Heart- Regular rate and rhythm, no murmurs, rubs or gallops, PMI not laterally displaced GI- soft, NT, ND, + BS Extremities- no clubbing, cyanosis, or edema Skin- no rash or lesion Psych- euthymic mood, full affect Neuro- strength and sensation are intact  LABS: Basic Metabolic Panel:  Recent Labs  10/25/15 1032 10/26/15 0240  NA 139 140  K 4.5 3.8  CL 104 105  CO2 26 24  GLUCOSE 101* 104*  BUN 15 15  CREATININE 1.24 0.95  CALCIUM 9.2 8.9  MG 2.2 2.0   Liver Function Tests:  Recent Labs  10/26/15 0240  AST 16  ALT 20  ALKPHOS 61  BILITOT 0.6  PROT 5.5*  ALBUMIN 3.1*   ASSESSMENT AND PLAN:   1. Persistent afib Now back in sinus with tikosyn qtc today is 450 msec Will  follow closely on tikosyn chads2vasc score is 1.  On eliquis Follow heart rate, may need to wean AV nodal agents now that he is in sinus  2. HTN Stable No change required today  3. OSA Uses CPAP  4. Obesity Weight loss advised   Thompson Grayer, MD 10/26/2015 8:09 AM

## 2015-10-27 DIAGNOSIS — R001 Bradycardia, unspecified: Secondary | ICD-10-CM

## 2015-10-27 LAB — BASIC METABOLIC PANEL
Anion gap: 11 (ref 5–15)
BUN: 11 mg/dL (ref 6–20)
CALCIUM: 8.9 mg/dL (ref 8.9–10.3)
CHLORIDE: 106 mmol/L (ref 101–111)
CO2: 23 mmol/L (ref 22–32)
CREATININE: 0.94 mg/dL (ref 0.61–1.24)
Glucose, Bld: 103 mg/dL — ABNORMAL HIGH (ref 65–99)
Potassium: 4.3 mmol/L (ref 3.5–5.1)
SODIUM: 140 mmol/L (ref 135–145)

## 2015-10-27 LAB — MAGNESIUM: Magnesium: 2.2 mg/dL (ref 1.7–2.4)

## 2015-10-27 MED ORDER — METOPROLOL SUCCINATE ER 50 MG PO TB24
50.0000 mg | ORAL_TABLET | Freq: Two times a day (BID) | ORAL | Status: DC
  Administered 2015-10-27 – 2015-10-28 (×3): 50 mg via ORAL
  Filled 2015-10-27 (×3): qty 1

## 2015-10-27 NOTE — Care Management Note (Signed)
Case Management Note Austin Gibbons RN, BSN Unit 2W-Case Manager [phone]  Patient Details  Name: Austin Park MRN: [id number] Date of Birth: Jun 13, 1951  Subjective/Objective:   Pt admitted with afib for Tikosyn load                 Action/Plan: PTA pt lived at home with wife- plan is to return home with wife- referral received for Tikosyn assistance- insurance check completed- Pt copay will be $50- prior auth not required - spoke with pt and wife at bedside- benefit coverage shared- pt's pharmacy is [facility]- call made to pharmacy who is in [employer] and can order drug in stock when script received- pt will need 7 day supply on discharge from [place]- MD please provide script with no refills to send to [place] - and then script with refills for pt to take to [facility].    Expected Discharge Date:                  Expected Discharge Plan:  Home/Self Care  In-House Referral:     Discharge planning Services  CM Consult, Medication Assistance  Post Acute Care Choice:    Choice offered to:     DME Arranged:    DME Agency:     HH Arranged:    HH Agency:     Status of Service:  In process, will continue to follow  Medicare Important Message Given:    Date Medicare IM Given:    Medicare IM give by:    Date Additional Medicare IM Given:    Additional Medicare Important Message give by:     If discussed at [place] of Stay Meetings, dates discussed:    Additional Comments:  Dawayne Patricia, RN 10/27/2015, 10:37 AM

## 2015-10-27 NOTE — Progress Notes (Signed)
Insurance coverage for Tikosyn benefits completed Pt copay will be $50- prior auth not required

## 2015-10-27 NOTE — Progress Notes (Addendum)
   SUBJECTIVE: The patient is doing well today.  At this time, he denies chest pain, shortness of breath, or any new concerns.   Marland Kitchen apixaban  5 mg Oral BID  . atorvastatin  20 mg Oral q1800  . dofetilide  500 mcg Oral BID  . losartan  25 mg Oral Daily  . metoprolol succinate  50 mg Oral BID  . sodium chloride flush  3 mL Intravenous Q12H      OBJECTIVE: Physical Exam: Filed Vitals:   10/26/15 1112 10/26/15 1500 10/26/15 2306 10/27/15 0517  BP: 114/63 108/64 106/66 110/63  Pulse: 62 58 50 56  Temp:  97.6 F (36.4 C) 97.6 F (36.4 C) 97.8 F (36.6 C)  TempSrc:  Oral Oral Oral  Resp:  18 18 20   Height:      Weight:      SpO2:  96% 93% 97%    Intake/Output Summary (Last 24 hours) at 10/27/15 0944 Last data filed at 10/27/15 0730  Gross per 24 hour  Intake    720 ml  Output      0 ml  Net    720 ml    Telemetry reveals sinus bradycardia  GEN- The patient is well appearing, alert and oriented x 3 today.   Head- normocephalic, atraumatic Eyes-  Sclera clear, conjunctiva pink Ears- hearing intact Oropharynx- clear Neck- supple, no JVP Lymph- no cervical lymphadenopathy Lungs- Clear to ausculation bilaterally, normal work of breathing Heart- Regular rate and rhythm, no murmurs, rubs or gallops, PMI not laterally displaced GI- soft, NT, ND, + BS Extremities- no clubbing, cyanosis, or edema Skin- no rash or lesion Psych- euthymic mood, full affect Neuro- strength and sensation are intact  LABS: Basic Metabolic Panel:  Recent Labs  10/26/15 0240 10/27/15 0539  NA 140 140  K 3.8 4.3  CL 105 106  CO2 24 23  GLUCOSE 104* 103*  BUN 15 11  CREATININE 0.95 0.94  CALCIUM 8.9 8.9  MG 2.0 2.2   Liver Function Tests:  Recent Labs  10/26/15 0240  AST 16  ALT 20  ALKPHOS 61  BILITOT 0.6  PROT 5.5*  ALBUMIN 3.1*   ASSESSMENT AND PLAN:   1. Persistent afib Now back in sinus with tikosyn qtc today is stable Will follow closely on tikosyn chads2vasc score  is 1.  On eliquis Due to bradycardia, I have stopped diltiazem today.  Will reduce metoprolol to 50mg  BID.  2. HTN Stable No change required today  3. OSA Uses CPAP  4. Obesity Weight loss advised  Anticipate discharge tomorrow if no further changes  Thompson Grayer, MD 10/27/2015 9:44 AM

## 2015-10-28 ENCOUNTER — Other Ambulatory Visit: Payer: Self-pay

## 2015-10-28 ENCOUNTER — Encounter (HOSPITAL_COMMUNITY): Payer: Self-pay

## 2015-10-28 ENCOUNTER — Encounter: Payer: Self-pay | Admitting: Internal Medicine

## 2015-10-28 LAB — MAGNESIUM: Magnesium: 2.3 mg/dL (ref 1.7–2.4)

## 2015-10-28 LAB — BASIC METABOLIC PANEL
ANION GAP: 12 (ref 5–15)
BUN: 16 mg/dL (ref 6–20)
CO2: 24 mmol/L (ref 22–32)
Calcium: 9 mg/dL (ref 8.9–10.3)
Chloride: 105 mmol/L (ref 101–111)
Creatinine, Ser: 1.03 mg/dL (ref 0.61–1.24)
GFR calc Af Amer: >60 mL/min (ref >60)
Glucose, Bld: 117 mg/dL — ABNORMAL HIGH (ref 65–99)
POTASSIUM: 4.2 mmol/L (ref 3.5–5.1)
Sodium: 141 mmol/L (ref 135–145)

## 2015-10-28 MED ORDER — METOPROLOL SUCCINATE ER 50 MG PO TB24: 50.0000 mg | ORAL_TABLET | Freq: Two times a day (BID) | ORAL | Status: DC

## 2015-10-28 MED ORDER — DOFETILIDE 500 MCG PO CAPS: 500.0000 ug | ORAL_CAPSULE | Freq: Two times a day (BID) | ORAL | Status: DC

## 2015-10-28 NOTE — Plan of Care (Signed)
Problem: Activity: Goal: Ability to tolerate increased activity will improve Outcome: Completed/Met Date Met:  10/28/15 Patient ambulating in hallway unassisted. No c/o SOB or chest pain

## 2015-10-28 NOTE — Discharge Instructions (Signed)

## 2015-10-28 NOTE — Discharge Summary (Signed)
ELECTROPHYSIOLOGY PROCEDURE DISCHARGE SUMMARY    Patient ID: Austin Park,  MRN: [id number], DOB/AGE: 1951-03-07 65 y.o.  Admit date: 10/25/2015 Discharge date: 10/28/2015  Primary Care Physician: [name], MD Primary Cardiologist: Dr. Angelena Form Electrophysiologist: Dr. Rayann Heman  Primary Discharge Diagnosis:  1.  Persistent atrial fibrillation status post Tikosyn loading this admission      CHADS2VAsc is at least 1, on Eliquis  Secondary Discharge Diagnosis:  1. HTN 2. Obesity 3. OSA 4. Chronic CHF (diastolic)  No Known Allergies   Brief HPI: Austin Park is a 65 y.o. male with a past medical history as noted above.  They were referred to EP in the outpatient setting for treatment options of atrial fibrillation.  Risks, benefits, and alternatives to Tikosyn were reviewed with the patient who wished to proceed.    Hospital Course:  The patient was admitted and Tikosyn was initiated.  Renal function and electrolytes were followed during the hospitalization.  Their QTc remained stable.  They were monitored until discharge on telemetry which demonstrated PAFib/SB.  He converted to SR after his first dose and maintained SR/SB, given his bradycardia his diltiazem was discontinued and his metoprolol down-titrated.  Last night he returned to AFib with fair rate control, generally 90's-110. He can Austin he is in Af but feeling well, no CP or SOB.  He was examined by Dr Rayann Heman who considered them stable for discharge to home.  Follow-up has been arranged with the AFib clinic in 1 week and with Dr Rayann Heman in 4 weeks.   Physical Exam: Filed Vitals:   10/27/15 2005 10/28/15 0412 10/28/15 0800 10/28/15 1059  BP: 108/78 109/68 120/66 114/77  Pulse: 103 92  102  Temp: 98.2 F (36.8 C) 97.6 F (36.4 C)    TempSrc: Oral Oral    Resp: 18 18    Height:      Weight:      SpO2: 97% 97%      GEN- The patient is obese, well appearing, alert and oriented x 3 today.   HEENT:  normocephalic, atraumatic; sclera clear, conjunctiva pink; hearing intact; oropharynx clear; neck supple, no JVP Lymph- no cervical lymphadenopathy Lungs- Clear to ausculation bilaterally, normal work of breathing.  No wheezes, rales, rhonchi Heart- irregular rate and rhythm, no murmurs, rubs or gallops, PMI not laterally displaced GI- soft, non-tender, non-distended Extremities- no clubbing, cyanosis, or edema MS- no significant deformity or atrophy Skin- warm and dry, no rash or lesion Psych- euthymic mood, full affect Neuro- strength and sensation are intact   Labs:   Lab Results  Component Value Date   WBC 10.2 08/04/2015   HGB 15.6 09/09/2015   HCT 46.0 09/09/2015   MCV 91.1 08/04/2015   PLT 193 08/04/2015    Recent Labs Lab 10/26/15 0240  10/28/15 0350  NA 140  < > 141  K 3.8  < > 4.2  CL 105  < > 105  CO2 24  < > 24  BUN 15  < > 16  CREATININE 0.95  < > 1.03  CALCIUM 8.9  < > 9.0  PROT 5.5*  --   --   BILITOT 0.6  --   --   ALKPHOS 61  --   --   ALT 20  --   --   AST 16  --   --   GLUCOSE 104*  < > 117*  < > = values in this interval not displayed.   Discharge Medications:  Medication List    STOP taking these medications        CARDIZEM CD 120 MG 24 hr capsule  Generic drug:  diltiazem      TAKE these medications        acetaminophen 325 MG tablet  Commonly known as:  TYLENOL  Take 325 mg by mouth every 6 (six) hours as needed for moderate pain.     dofetilide 500 MCG capsule  Commonly known as:  TIKOSYN  Take 1 capsule (500 mcg total) by mouth 2 (two) times daily.     ELIQUIS 5 MG Tabs tablet  Generic drug:  apixaban  Take 5 mg by mouth 2 (two) times daily.     losartan 50 MG tablet  Commonly known as:  COZAAR  Take 0.5 tablets (25 mg total) by mouth daily.     metoprolol succinate 50 MG 24 hr tablet  Commonly known as:  TOPROL-XL  Take 1 tablet (50 mg total) by mouth 2 (two) times daily.     MULTI VITAMIN PO  Take 1 tablet by  mouth daily.     simvastatin 40 MG tablet  Commonly known as:  ZOCOR  Take 1 tablet (40 mg total) by mouth every evening.        Disposition: Home  Follow-up Information    Follow up with [facility] On 11/02/2015.   Specialty:  Cardiology   Why:  1:30PM   Contact information:   [address] [id number] [place] [phone]      Follow up with Thompson Grayer, MD On 11/29/2015.   Specialty:  Cardiology   Why:  1:45PM   Contact information:   [place] [address] [phone]       Duration of Discharge Encounter: Greater than 30 minutes including physician time.  Signed, Tommye Standard, PA-C 10/28/2015 12:15 PM  I have seen, examined the patient, and reviewed the above assessment and plan.  On exam, irrr.  Changes to above are made where necessary.  Unfortunately, he is back in afib this am.  QT has been stable.  Discussed options with the patient.  He would prefer to discharge on tikosyn and follow-up in AF clinic next week.  IF he has not converted to sinus rhythm would anticipate cardioversion at that time.  He did feel better in sinus.  He will take metoprolol 50mg  BID and take additional 25mg -50mg  prn palpitations or HR > 100 bpm.  Co Sign: Thompson Grayer, MD 10/28/2015 12:15 PM

## 2015-10-28 NOTE — Care Management Note (Signed)
Case Management Note Marvetta Gibbons RN, BSN Unit 2W-Case Manager [phone]  Patient Details  Name: Austin Park MRN: [id number] Date of Birth: 11-10-1950  Subjective/Objective:   Pt admitted with afib for Tikosyn load                 Action/Plan: PTA pt lived at home with wife- plan is to return home with wife- referral received for Tikosyn assistance- insurance check completed- Pt copay will be $50- prior auth not required - spoke with pt and wife at bedside- benefit coverage shared- pt's pharmacy is [facility]- call made to pharmacy who is in [employer] and can order drug in stock when script received- pt will need 7 day supply on discharge from [place]- MD please provide script with no refills to send to [place] - and then script with refills for pt to take to [facility].    Expected Discharge Date:     10/28/15             Expected Discharge Plan:  Home/Self Care  In-House Referral:     Discharge planning Services  CM Consult, Medication Assistance  Post Acute Care Choice:    Choice offered to:     DME Arranged:    DME Agency:     HH Arranged:    HH Agency:     Status of Service:  Completed, signed off  Medicare Important Message Given:    Date Medicare IM Given:    Medicare IM give by:    Date Additional Medicare IM Given:    Additional Medicare Important Message give by:     If discussed at [place] of Stay Meetings, dates discussed:    Discharge Disposition: Home/self care   Additional Comments:  10/28/15- 1330- Marvetta Gibbons- RN, BSN- pt for d/c home today- 7 day script for Tikosyn has been sent to [facility] by bedside RN- No further CM needs  Dawayne Patricia, RN 10/28/2015, 1:37 PM

## 2015-10-28 NOTE — Progress Notes (Signed)
Removed IV. No issues at present. Reviewed discharge instructions with patient and family. Awaiting discharge.  Kayra Crowell, Mervin Kung RN

## 2015-11-02 ENCOUNTER — Encounter (HOSPITAL_COMMUNITY): Payer: Self-pay | Admitting: Nurse Practitioner

## 2015-11-02 ENCOUNTER — Ambulatory Visit (HOSPITAL_COMMUNITY)
Admit: 2015-11-02 | Discharge: 2015-11-02 | Disposition: A | Discharge status: Home / Self Care | Payer: 59 | Source: Ambulatory Visit | Attending: Nurse Practitioner | Admitting: Nurse Practitioner

## 2015-11-02 VITALS — BP 128/82 | HR 69 | Ht 75.0 in | Wt 264.8 lb

## 2015-11-02 DIAGNOSIS — Z6833 Body mass index (BMI) 33.0-33.9, adult: Secondary | ICD-10-CM | POA: Diagnosis not present

## 2015-11-02 DIAGNOSIS — Z8249 Family history of ischemic heart disease and other diseases of the circulatory system: Secondary | ICD-10-CM | POA: Insufficient documentation

## 2015-11-02 DIAGNOSIS — G4733 Obstructive sleep apnea (adult) (pediatric): Secondary | ICD-10-CM | POA: Insufficient documentation

## 2015-11-02 DIAGNOSIS — E663 Overweight: Secondary | ICD-10-CM | POA: Diagnosis not present

## 2015-11-02 DIAGNOSIS — I10 Essential (primary) hypertension: Secondary | ICD-10-CM | POA: Insufficient documentation

## 2015-11-02 DIAGNOSIS — I4891 Unspecified atrial fibrillation: Secondary | ICD-10-CM | POA: Diagnosis present

## 2015-11-02 DIAGNOSIS — E7800 Pure hypercholesterolemia, unspecified: Secondary | ICD-10-CM | POA: Diagnosis not present

## 2015-11-02 DIAGNOSIS — Z7902 Long term (current) use of antithrombotics/antiplatelets: Secondary | ICD-10-CM | POA: Diagnosis not present

## 2015-11-02 DIAGNOSIS — Z79899 Other long term (current) drug therapy: Secondary | ICD-10-CM | POA: Insufficient documentation

## 2015-11-02 DIAGNOSIS — I481 Persistent atrial fibrillation: Secondary | ICD-10-CM | POA: Insufficient documentation

## 2015-11-02 DIAGNOSIS — Z87891 Personal history of nicotine dependence: Secondary | ICD-10-CM | POA: Insufficient documentation

## 2015-11-02 DIAGNOSIS — I4819 Other persistent atrial fibrillation: Secondary | ICD-10-CM

## 2015-11-02 LAB — BASIC METABOLIC PANEL
ANION GAP: 9 (ref 5–15)
BUN: 13 mg/dL (ref 6–20)
CALCIUM: 9.3 mg/dL (ref 8.9–10.3)
CO2: 22 mmol/L (ref 22–32)
Chloride: 109 mmol/L (ref 101–111)
Creatinine, Ser: 0.95 mg/dL (ref 0.61–1.24)
Glucose, Bld: 102 mg/dL — ABNORMAL HIGH (ref 65–99)
Potassium: 4 mmol/L (ref 3.5–5.1)
SODIUM: 140 mmol/L (ref 135–145)

## 2015-11-02 LAB — MAGNESIUM: Magnesium: 2 mg/dL (ref 1.7–2.4)

## 2015-11-02 NOTE — Progress Notes (Signed)
Patient ID: Austin Park, male   DOB: 07/12/1951, 65 y.o.   MRN: [id number]     Primary Care Physician: Austin Hams, MD Referring Physician: Dr. Consuella Lose Park is a 64 y.o. male with a h/o persisitent afib that failed tikosyn and cardioversion and was rently hospitalized for tikosyn load. He converted after the first dose to SR, but returned to afib prior to d/c. However, he is now back in SR. He feels better in SR with less fatigue. He has had his prescription filled for tikosyn, is aware of precautions. He continues on eliquis. Has been able to walk for exercise and tolerate better.  Today, he denies symptoms of palpitations, chest pain, shortness of breath, orthopnea, PND, lower extremity edema, dizziness, presyncope, syncope, or neurologic sequela. The patient is tolerating medications without difficulties and is otherwise without complaint today.   Past Medical History  Diagnosis Date  . Anxiety   . Obstructive sleep apnea on CPAP     on CPAP  . Kidney stones   . Insomnia   . Tobacco abuse QUIT 2014  . HTN (hypertension)   . Overweight   . Persistent atrial fibrillation (Austin Park)   . Chronic diastolic CHF (congestive heart failure) ([place])   . Hypercholesterolemia   . Basal cell carcinoma of skin    Past Surgical History  Procedure Laterality Date  . Appendectomy    . Tonsillectomy    . Cardioversion N/A 08/09/2015    Procedure: CARDIOVERSION;  Surgeon: Austin Casino, MD;  Location: [facility] ENDOSCOPY;  Service: Cardiovascular;  Laterality: N/A;  . Cardioversion N/A 09/09/2015    Procedure: CARDIOVERSION;  Surgeon: Austin Dresser, MD;  Location: [place];  Service: Cardiovascular;  Laterality: N/A;    Current Outpatient Prescriptions  Medication Sig Dispense Refill  . acetaminophen (TYLENOL) 325 MG tablet Take 325 mg by mouth every 6 (six) hours as needed for moderate pain.    Marland Kitchen apixaban (ELIQUIS) 5 MG TABS tablet Take 5 mg by mouth 2 (two) times daily.    Marland Kitchen  dofetilide (TIKOSYN) 500 MCG capsule Take 1 capsule (500 mcg total) by mouth 2 (two) times daily. 60 capsule 3  . losartan (COZAAR) 50 MG tablet Take 0.5 tablets (25 mg total) by mouth daily. 45 tablet 3  . metoprolol succinate (TOPROL-XL) 50 MG 24 hr tablet Take 1 tablet (50 mg total) by mouth 2 (two) times daily. 60 tablet 3  . Multiple Vitamin (MULTI VITAMIN PO) Take 1 tablet by mouth daily.    . simvastatin (ZOCOR) 40 MG tablet Take 1 tablet (40 mg total) by mouth every evening. (Patient taking differently: Take 40 mg by mouth every morning. ) 30 tablet 3   No current facility-administered medications for this encounter.    No Known Allergies  Social History   Social History  . Marital Status: Married    Spouse Name: N/A  . Number of Children: N/A  . Years of Education: N/A   Occupational History  . Not on file.   Social History Main Topics  . Smoking status: Former Smoker -- 40 years    Types: Cigarettes    Quit date: 06/30/2013  . Smokeless tobacco: Not on file  . Alcohol Use: No  . Drug Use: No  . Sexual Activity: Not on file   Other Topics Concern  . Not on file   Social History Narrative   Pt lives in [place] with spouse.  Works as a Presenter, broadcasting.  Family History  Problem Relation Age of Onset  . Hypertension Father   . Cancer Father     LYMPHOMA  . Hypertension Mother   . Coronary artery disease    . Hypercholesterolemia    . Hypertension Brother   . Hypertension Brother   . Hypertension Sister     ROS- All systems are reviewed and negative except as per the HPI above  Physical Exam: Filed Vitals:   11/02/15 1342  BP: 128/82  Pulse: 69  Height: 6\' 3"  (1.905 m)  Weight: 264 lb 12.8 oz (120.112 kg)    GEN- The patient is well appearing, alert and oriented x 3 today.   Head- normocephalic, atraumatic Eyes-  Sclera clear, conjunctiva pink Ears- hearing intact Oropharynx- clear Neck- supple, no JVP Lymph- no cervical  lymphadenopathy Lungs- Clear to ausculation bilaterally, normal work of breathing Heart- Regular rate and rhythm, no murmurs, rubs or gallops, PMI not laterally displaced GI- soft, NT, ND, + BS Extremities- no clubbing, cyanosis, or edema MS- no significant deformity or atrophy Skin- no rash or lesion Psych- euthymic mood, full affect Neuro- strength and sensation are intact  EKG- NSR at 69 bpm, pr int 174 ms, qrs int 84 ms, qtc 447 ms Qtc (stable) Epic records reviewed Last mag 2.3, K+ 4.2-2/16  Assessment and Plan: 1. Peristent afib Failed prior cardioversion/flecainde In SR today on tikosyn Continue eliquis Bmet/mag today  F/u Dr. Aundra Park 3/14, Dr. Rayann Park 3/20  F/u afib clinic as needed  Austin Park, [facility] [address] [place], [place] [address] [phone]

## 2015-11-04 ENCOUNTER — Ambulatory Visit (HOSPITAL_COMMUNITY): Payer: 59 | Admitting: Nurse Practitioner

## 2015-11-23 ENCOUNTER — Encounter: Payer: Self-pay | Admitting: *Deleted

## 2015-11-23 ENCOUNTER — Ambulatory Visit (INDEPENDENT_AMBULATORY_CARE_PROVIDER_SITE_OTHER): Payer: 59 | Admitting: Cardiology

## 2015-11-23 ENCOUNTER — Encounter: Payer: Self-pay | Admitting: Cardiology

## 2015-11-23 VITALS — BP 122/64 | HR 78 | Ht 75.0 in | Wt 263.0 lb

## 2015-11-23 DIAGNOSIS — Z860101 Personal history of adenomatous and serrated colon polyps: Secondary | ICD-10-CM | POA: Insufficient documentation

## 2015-11-23 DIAGNOSIS — Z86010 Personal history of colonic polyps: Secondary | ICD-10-CM | POA: Insufficient documentation

## 2015-11-23 DIAGNOSIS — E782 Mixed hyperlipidemia: Secondary | ICD-10-CM | POA: Insufficient documentation

## 2015-11-23 DIAGNOSIS — F411 Generalized anxiety disorder: Secondary | ICD-10-CM | POA: Insufficient documentation

## 2015-11-23 DIAGNOSIS — I4891 Unspecified atrial fibrillation: Secondary | ICD-10-CM

## 2015-11-23 DIAGNOSIS — G4700 Insomnia, unspecified: Secondary | ICD-10-CM | POA: Insufficient documentation

## 2015-11-23 DIAGNOSIS — J309 Allergic rhinitis, unspecified: Secondary | ICD-10-CM | POA: Insufficient documentation

## 2015-11-23 DIAGNOSIS — E669 Obesity, unspecified: Secondary | ICD-10-CM | POA: Insufficient documentation

## 2015-11-23 DIAGNOSIS — I10 Essential (primary) hypertension: Secondary | ICD-10-CM | POA: Insufficient documentation

## 2015-11-23 DIAGNOSIS — G4730 Sleep apnea, unspecified: Secondary | ICD-10-CM | POA: Insufficient documentation

## 2015-11-23 LAB — CBC WITH DIFFERENTIAL/PLATELET
BASOS ABS: 0 10*3/uL (ref 0.0–0.1)
BASOS PCT: 0 % (ref 0–1)
Eosinophils Absolute: 0.3 10*3/uL (ref 0.0–0.7)
Eosinophils Relative: 3 % (ref 0–5)
HCT: 49.2 % (ref 39.0–52.0)
HEMOGLOBIN: 16.9 g/dL (ref 13.0–17.0)
Lymphocytes Relative: 38 % (ref 12–46)
Lymphs Abs: 4.1 10*3/uL — ABNORMAL HIGH (ref 0.7–4.0)
MCH: 30.3 pg (ref 26.0–34.0)
MCHC: 34.3 g/dL (ref 30.0–36.0)
MCV: 88.3 fL (ref 78.0–100.0)
MONOS PCT: 5 % (ref 3–12)
MPV: 11 fL (ref 8.6–12.4)
Monocytes Absolute: 0.5 10*3/uL (ref 0.1–1.0)
NEUTROS ABS: 5.8 10*3/uL (ref 1.7–7.7)
NEUTROS PCT: 54 % (ref 43–77)
Platelets: 203 10*3/uL (ref 150–400)
RBC: 5.57 MIL/uL (ref 4.22–5.81)
RDW: 14.1 % (ref 11.5–15.5)
WBC: 10.8 10*3/uL — ABNORMAL HIGH (ref 4.0–10.5)

## 2015-11-23 LAB — BASIC METABOLIC PANEL
BUN: 13 mg/dL (ref 7–25)
CALCIUM: 9.3 mg/dL (ref 8.6–10.3)
CHLORIDE: 105 mmol/L (ref 98–110)
CO2: 24 mmol/L (ref 20–31)
Creat: 0.98 mg/dL (ref 0.70–1.25)
Glucose, Bld: 107 mg/dL — ABNORMAL HIGH (ref 65–99)
Potassium: 4.2 mmol/L (ref 3.5–5.3)
SODIUM: 140 mmol/L (ref 135–146)

## 2015-11-23 LAB — MAGNESIUM: MAGNESIUM: 2 mg/dL (ref 1.5–2.5)

## 2015-11-23 NOTE — Progress Notes (Signed)
Patient ID: Austin Park, male   DOB: 07-Jul-1951, 65 y.o.   MRN: [id number] PCP: Dr. Delfina Redwood  65 yo with history of HTN and atrial fibrillation presents for followup. Patient had a ruptured appendix in 10/11. Post-appendectomy, he developed atrial fibrillation. This was somewhat difficult to control and did not spontaneously convert, so TEE-guided cardioversion was done. He was on coumadin for about 6 wks after cardioversion. We then stopped it and put him on ASA as his CHADSVASC score was only 1.  He developed recurrent atrial fibrillation with RVR in 11/16.  DCCV was unsuccessful.  He was put on flecainide and went back into NSR but atrial fibrillation recurred.  Flecainide was increased to 75 mg bid and he had DCCV to NSR again in 12/16.  However, atrial fibrillation recurred.  He tolerated flecainide poorly due to dizziness and visual changes.  He was seen by Dr Rayann Heman and it was decided that rather than atrial fibrillation ablation, we would admit him for dofetilide initiation.  He was started on dofetilide and is now back in NSR.    He has not felt palpitations.  Generally doing well.  Able to work out in yard without dyspnea.  Can climb a flight of stairs.  No chest pain.  No BRBPR or melena.  Weight is down 3 lbs.   ECG: NSR, inferior Qs, QTc 465 msec  Labs (11/16): K 4.6, creatinine 1.05 Labs (2/17): K 4, creatinine 0.95, Mg normal  PMH: 1. Hypertension 2. Acute appendicitis s/p appendectomy 10/11 (had rupture) 3. Atrial fibrillation: Post-op appendectdomy in 10/11, had TEE-guided DCCV.  Recurrent in 11/16.  Unsuccessful DCCV, started on flecainide.  DCCV again in 12/16 with resumption of NSR but went back into atrial fibrillation.  Now in NSR on dofetilide.  4. Diastolic CHF in the setting of atrial fibrillation with RVR. Echo (10/11): EF 60-65%, mild LVH, mild to moderate LAE. Echo (12/16) with EF 55%, mild RV dilation with normal systolic function.  5. OSA on CPAP 6.  Hyperlipidemia 7. Cardiolite (12/16): Normal study.   Family History: Mother with MI in her 48s, father with PCI at 37, father with AAA  Social History: The patient is married.  He teaches truck driving He has an occasional cigar here and there. He quit smoking cigarettes in 10/11 (1 pack/week prior) Occasional ETOH.   ROS: All systems reviewed and negative except as per HPI.    Current Outpatient Prescriptions  Medication Sig Dispense Refill  . acetaminophen (TYLENOL) 325 MG tablet Take 325 mg by mouth every 6 (six) hours as needed for moderate pain.    Marland Kitchen apixaban (ELIQUIS) 5 MG TABS tablet Take 5 mg by mouth 2 (two) times daily.    Marland Kitchen dofetilide (TIKOSYN) 500 MCG capsule Take 1 capsule (500 mcg total) by mouth 2 (two) times daily. 60 capsule 3  . losartan (COZAAR) 50 MG tablet Take 0.5 tablets (25 mg total) by mouth daily. 45 tablet 3  . metoprolol succinate (TOPROL-XL) 50 MG 24 hr tablet Take 1 tablet (50 mg total) by mouth 2 (two) times daily. 60 tablet 3  . Multiple Vitamin (MULTI VITAMIN PO) Take 1 tablet by mouth daily.    . simvastatin (ZOCOR) 40 MG tablet Take 1 tablet (40 mg total) by mouth every evening. (Patient taking differently: Take 40 mg by mouth every morning. ) 30 tablet 3   No current facility-administered medications for this visit.   BP 122/64 mmHg  Pulse 78  Ht 6\' 3"  (1.905 m)  Wt 263 lb (119.296 kg)  BMI 32.87 kg/m2 General: NAD Neck: Thick, no JVD, no thyromegaly or thyroid nodule.  Lungs: Clear to auscultation bilaterally with normal respiratory effort. CV: Nondisplaced PMI.  Heart regular S1/S2, no S3/S4, no murmur.  No peripheral edema.  No carotid bruit.  Normal pedal pulses.  Abdomen: Soft, nontender, no hepatosplenomegaly, no distention.  Skin: Intact without lesions or rashes.  Neurologic: Alert and oriented x 3.  Psych: Normal affect. Extremities: No clubbing or cyanosis.  HEENT: Normal.   Assessment/Plan: 1. Atrial fibrillation:  Paroxysmal.   - Now in NSR on dofetilide.  QTc ok on today's ECG. - He continues on Eliquis.  CHADSVASC = 1 (HTN).  Check CBC and BMET/Mg today.   - Control risk factors: BP is ok, he is using CPAP.  Needs to limit ETOH. Weight loss would be helpful. 2. OSA: Continue CPAP. 3. HTN: Controlled.   [name] 11/23/2015

## 2015-11-23 NOTE — Patient Instructions (Addendum)
Medication Instructions:  Your physician recommends that you continue on your current medications as directed. Please refer to the Current Medication list given to you today.   Labwork:  Lab work to be done today--BMP,CBC, Magnesium  Testing/Procedures: none  Follow-Up: Your physician wants you to follow-up in: 6 months.  You will receive a reminder letter in the mail two months in advance. If you don't receive a letter, please call our office to schedule the follow-up appointment.   Any Other Special Instructions Will Be Listed Below (If Applicable).     If you need a refill on your cardiac medications before your next appointment, please call your pharmacy.

## 2015-11-29 ENCOUNTER — Encounter: Payer: 59 | Admitting: Internal Medicine

## 2015-11-30 ENCOUNTER — Telehealth: Payer: Self-pay | Admitting: Cardiology

## 2015-11-30 ENCOUNTER — Encounter: Payer: Self-pay | Admitting: *Deleted

## 2015-11-30 NOTE — Telephone Encounter (Signed)
Letter written. Will fax I received busy signal when trying to fax several times.  I spoke with Rise Paganini and she asked I mail letter to her attention- [facility] [place]. [place], [place] Will mail letter.

## 2015-11-30 NOTE — Telephone Encounter (Signed)
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

## 2015-11-30 NOTE — Telephone Encounter (Signed)
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  I asked if OK to talk with them and pt gave me permission to contact them and give them needed information.  He gave his employer the letter Dr. Aundra Dubin had written and he states they must need more information. 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  I told pt I would contact his employer.  I spoke with Rise Paganini at [facility]. 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  Also needs active CDL medical card.  I told her our office did not do CDL exams but I would send Dr. 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 Note can be faxed to number below.   She will contact pt about having CDL exam done.

## 2015-11-30 NOTE — Telephone Encounter (Signed)
Follow Up ° °Pt returned call//  °

## 2015-11-30 NOTE — Telephone Encounter (Signed)
Follow up ° ° ° ° ° °Returning a call to the nurse °

## 2015-11-30 NOTE — Telephone Encounter (Signed)
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  Please fax note to [phone]

## 2015-11-30 NOTE — Telephone Encounter (Signed)
I placed call to pt to see if we can speak with caller Rise Paganini at [facility]). Left message to call back.

## 2015-11-30 NOTE — Telephone Encounter (Signed)
Left message to call back  

## 2015-12-09 ENCOUNTER — Telehealth: Payer: Self-pay | Admitting: Cardiology

## 2015-12-09 NOTE — Telephone Encounter (Signed)
Walk in pt form-[employer] papers dropped off- patient is asking for return to work note and ok to [employer].  This has already been taken care of according to [facility] A note in EPIC. I also spoke with Fraser Din this am and she made me aware the clearance note was mailed out.  I called patient left him a VM asking for a return phone call.

## 2016-04-06 ENCOUNTER — Other Ambulatory Visit: Payer: Self-pay | Admitting: Cardiology

## 2016-04-06 DIAGNOSIS — I4819 Other persistent atrial fibrillation: Secondary | ICD-10-CM

## 2016-04-06 MED ORDER — METOPROLOL SUCCINATE ER 50 MG PO TB24: 50.0000 mg | ORAL_TABLET | Freq: Two times a day (BID) | ORAL | 8 refills | Status: DC

## 2016-07-12 NOTE — Progress Notes (Signed)
CARDIOLOGY OFFICE NOTE  Date:  07/17/2016    Austin Park Date of Birth: 1951-01-02 Medical Record [id number]  PCP:  [name], MD  Cardiologist:  Servando [facility]  Chief Complaint  Patient presents with  . Atrial Fibrillation    Follow up visit - seen for Dr. Aundra Dubin    History of Present Illness: Austin Park is a 64 y.o. male who presents today for an 8 month check. Seen for Dr. Aundra Dubin.   He had a ruptured appendix in 10/11. Post-appendectomy, he developed atrial fibrillation. This was somewhat difficult to control and did not spontaneously convert, so TEE-guided cardioversion was done. He was on coumadin for about 6 wks after cardioversion. We then stopped it and put him on ASA as his CHADSVASC score was only 1.  He developed recurrent atrial fibrillation with RVR in 11/16.  DCCV was unsuccessful. He was put on flecainide and went back into NSR but atrial fibrillation recurred. Flecainide was increased to 75 mg bid and he had DCCV to NSR again in 12/16. However, atrial fibrillation recurred.  He tolerated flecainide poorly due to dizziness and visual changes.  He was seen by Dr Rayann Heman and it was decided that rather than atrial fibrillation ablation, we would admit him for dofetilide initiation.  He was started on dofetilide and has been able to maintain NSR.   Last seen in March and was felt to be doing well.   Comes in today. Here alone. He has been maintained on Eliquis. Rare bout of AF. May last 4 to 5 hours. Will use an extra dose of Metoprolol - does this maybe once a week - sometimes not at all. No chest pain. Has a chest cold now. Fever last night. Very congested. Tolerating his medicines. PCP checks his lipids. Planning on moving to [place] [employer] in about 8 months.   PMH: 1. Hypertension 2. Acute appendicitis s/p appendectomy 10/11 (had rupture) 3. Atrial fibrillation: Post-op appendectdomy in 10/11, had TEE-guided DCCV.  Recurrent in 11/16.   Unsuccessful DCCV, started on flecainide.  DCCV again in 12/16 with resumption of NSR but went back into atrial fibrillation.  Now in NSR on dofetilide.  4. Diastolic CHF in the setting of atrial fibrillation with RVR. Echo (10/11): EF 60-65%, mild LVH, mild to moderate LAE. Echo (12/16) with EF 55%, mild RV dilation with normal systolic function.  5. OSA on CPAP 6. Hyperlipidemia 7. Cardiolite (12/16): Normal study.    Past Medical History:  Diagnosis Date  . Anxiety   . Basal cell carcinoma of skin   . Chronic diastolic CHF (congestive heart failure) ([place])   . HTN (hypertension)   . Hypercholesterolemia   . Insomnia   . Kidney stones   . Obstructive sleep apnea on CPAP    on CPAP  . Overweight   . Persistent atrial fibrillation ([place])   . Tobacco abuse QUIT 2014    Past Surgical History:  Procedure Laterality Date  . APPENDECTOMY    . CARDIOVERSION N/A 08/09/2015   Procedure: CARDIOVERSION;  Surgeon: [name], MD;  Location[facility] ENDOSCOPY;  Service: Cardiovascular;  Laterality: N/A;  . CARDIOVERSION N/A 09/09/2015   Procedure: CARDIOVERSION;  Surgeon: Larey Dresser, MD;  Location: [place];  Service: Cardiovascular;  Laterality: N/A;  . TONSILLECTOMY       Medications: Current Outpatient Prescriptions  Medication Sig Dispense Refill  . acetaminophen (TYLENOL) 325 MG tablet Take 325 mg by mouth every 6 (six) hours as  needed for moderate pain.    Marland Kitchen apixaban (ELIQUIS) 5 MG TABS tablet Take 5 mg by mouth 2 (two) times daily.    Marland Kitchen dofetilide (TIKOSYN) 500 MCG capsule Take 1 capsule (500 mcg total) by mouth 2 (two) times daily. 60 capsule 3  . HYDROcodone-acetaminophen (NORCO/VICODIN) 5-325 MG tablet Take 1 tablet by mouth at bedtime.     Marland Kitchen losartan (COZAAR) 50 MG tablet Take 0.5 tablets (25 mg total) by mouth daily. 45 tablet 3  . methocarbamol (ROBAXIN) 750 MG tablet Take 750 mg by mouth 2 (two) times daily.     . metoprolol succinate (TOPROL-XL) 50 MG 24 hr  tablet Take 1 tablet (50 mg total) by mouth 2 (two) times daily. Ok to use extra tablet prn palpitations. 75 tablet 11  . Multiple Vitamin (MULTI VITAMIN PO) Take 1 tablet by mouth daily.    Marland Kitchen amoxicillin (AMOXIL) 500 MG capsule Take 1 capsule (500 mg total) by mouth 3 (three) times daily. 21 capsule 0  . simvastatin (ZOCOR) 40 MG tablet Take 1 tablet (40 mg total) by mouth every evening. (Patient taking differently: Take 40 mg by mouth every morning. ) 30 tablet 3   No current facility-administered medications for this visit.     Allergies: No Known Allergies  Social History: The patient  reports that he quit smoking about 3 years ago. His smoking use included Cigarettes. He quit after 40.00 years of use. He does not have any smokeless tobacco history on file. He reports that he does not drink alcohol or use drugs.   Family History: The patient's family history includes Cancer in his father; Hypertension in his brother, brother, father, mother, and sister.   Review of Systems: Please see the history of present illness.   Otherwise, the review of systems is positive for none.   All other systems are reviewed and negative.   Physical Exam: VS:  BP 118/80   Pulse 75   Ht 6\' 3"  (1.905 m)   Wt 261 lb 12.8 oz (118.8 kg)   BMI 32.72 kg/m  .  BMI Body mass index is 32.72 kg/m.  Wt Readings from Last 3 Encounters:  07/17/16 261 lb 12.8 oz (118.8 kg)  11/23/15 263 lb (119.3 kg)  11/02/15 264 lb 12.8 oz (120.1 kg)    General: Pleasant. Well developed, well nourished and in no acute distress.   HEENT: Normal.  Neck: Supple, no JVD, carotid bruits, or masses noted.  Cardiac: Regular rate and rhythm. No murmurs, rubs, or gallops. No edema.  Respiratory:  Lungs are very coarse bilaterally with normal work of breathing. He has rhonchi that clear with coughing. GI: Soft and nontender.  MS: No deformity or atrophy. Gait and ROM intact.  Skin: Warm and dry. Color is normal.  Neuro:  Strength  and sensation are intact and no gross focal deficits noted.  Psych: Alert, appropriate and with normal affect.   LABORATORY DATA:  EKG:  EKG is ordered today. This demonstrates NSR - QT is 390/QTc is 435.  Lab Results  Component Value Date   WBC 10.8 (H) 11/23/2015   HGB 16.9 11/23/2015   HCT 49.2 11/23/2015   PLT 203 11/23/2015   GLUCOSE 107 (H) 11/23/2015   CHOL 185 10/11/2010   TRIG 82.0 10/11/2010   HDL 50.30 10/11/2010   LDLDIRECT 191.3 07/14/2010   LDLCALC 118 (H) 10/11/2010   ALT 20 10/26/2015   AST 16 10/26/2015   NA 140 11/23/2015   K 4.2 11/23/2015  CL 105 11/23/2015   CREATININE 0.98 11/23/2015   BUN 13 11/23/2015   CO2 24 11/23/2015   INR 2.4 07/14/2010    BNP (last 3 results) No results for input(s): BNP in the last 8760 hours.  ProBNP (last 3 results) No results for input(s): PROBNP in the last 8760 hours.   Other Studies Reviewed Today:  Myoview Study Highlights 08/2015    The study is normal.  This is a low risk study.   1. Low risk study 2. Nl perfusion. 3. Not gated secondary to AFIB    Echo Study Conclusions 08/2015  - Left ventricle: The cavity size was normal. Wall thickness was   increased in a pattern of mild LVH. The estimated ejection   fraction was 55%. Although no diagnostic regional wall motion   abnormality was identified, this possibility cannot be completely   excluded on the basis of this study. Indeterminant diastolic   function (atrial fibrillation). - Aortic valve: There was no stenosis. - Mitral valve: There was no significant regurgitation. - Left atrium: The atrium was mildly dilated. - Right ventricle: The cavity size was mildly dilated. Systolic   function was normal. - Right atrium: The atrium was mildly dilated. - Tricuspid valve: There was mild-moderate regurgitation. Peak   RV-RA gradient (S): 26 mm Hg. - Pulmonary arteries: PA peak pressure: 29 mm Hg (S). - Inferior vena cava: The vessel was normal in  size. The   respirophasic diameter changes were in the normal range (>= 50%),   consistent with normal central venous pressure.  Impressions:  - The patient was in atrial fibrillation. Normal LV size with EF   55%. Mildly dilated RV with normal systolic function. Biatrial   enlargement.   Assessment/Plan:  1. PAF - on Tikosyn. Remains in NSR - lab today.   2. Chronic anticoagulation - on Eliquis - no problems noted. CBC today.   3. OSA - on CPAP  4. HTN - BP ok  5. Bronchitis - will treat with Amoxicillin 500 mg TID for a week. Advised what OTC agents he can use.   Current medicines are reviewed with the patient today.  The patient does not have concerns regarding medicines other than what has been noted above.  The following changes have been made:  See above.  Labs/ tests ordered today include:    Orders Placed This Encounter  Procedures  . Basic metabolic panel  . CBC  . Magnesium  . EKG 12-Lead     Disposition:   FU with me and Dr. Rayann Heman going forward. I will see back in 6 months.   Patient is agreeable to this plan and will call if any problems develop in the interim.   Signed: Burtis Junes, RN, ANP-C 07/17/2016 4:09 PM  [facility] [address] [place], [place]  [address] Phone: [phone] Fax: [phone]

## 2016-07-17 ENCOUNTER — Ambulatory Visit (INDEPENDENT_AMBULATORY_CARE_PROVIDER_SITE_OTHER): Payer: 59 | Admitting: Nurse Practitioner

## 2016-07-17 ENCOUNTER — Encounter: Payer: Self-pay | Admitting: Nurse Practitioner

## 2016-07-17 VITALS — BP 118/80 | HR 75 | Ht 75.0 in | Wt 261.8 lb

## 2016-07-17 DIAGNOSIS — Z79899 Other long term (current) drug therapy: Secondary | ICD-10-CM

## 2016-07-17 DIAGNOSIS — I480 Paroxysmal atrial fibrillation: Secondary | ICD-10-CM

## 2016-07-17 DIAGNOSIS — I481 Persistent atrial fibrillation: Secondary | ICD-10-CM | POA: Diagnosis not present

## 2016-07-17 DIAGNOSIS — I4819 Other persistent atrial fibrillation: Secondary | ICD-10-CM

## 2016-07-17 LAB — CBC
HCT: 46.3 % (ref 38.5–50.0)
Hemoglobin: 16 g/dL (ref 13.2–17.1)
MCH: 30.5 pg (ref 27.0–33.0)
MCHC: 34.6 g/dL (ref 32.0–36.0)
MCV: 88.4 fL (ref 80.0–100.0)
MPV: 11 fL (ref 7.5–12.5)
Platelets: 202 10*3/uL (ref 140–400)
RBC: 5.24 MIL/uL (ref 4.20–5.80)
RDW: 13.9 % (ref 11.0–15.0)
WBC: 9.8 10*3/uL (ref 3.8–10.8)

## 2016-07-17 MED ORDER — METOPROLOL SUCCINATE ER 50 MG PO TB24: 50.0000 mg | ORAL_TABLET | Freq: Two times a day (BID) | ORAL | 11 refills | Status: DC

## 2016-07-17 MED ORDER — AMOXICILLIN 500 MG PO CAPS: 500.0000 mg | ORAL_CAPSULE | Freq: Three times a day (TID) | ORAL | 0 refills | Status: DC

## 2016-07-17 NOTE — Patient Instructions (Addendum)
We will be checking the following labs today - BMET, CBC & Mg   Medication Instructions:    Continue with your current medicines.   I am giving you a written Rx for your Toprol  I have sent in a prescription for Amoxicillin 500 mg to take 3 times a day for one week - this is for your chest congestion/cold  Ok to use plain Mucinex, Coricidin HBP products, plain antihistamines, Flonase/Nasocort spray    Testing/Procedures To Be Arranged:  N/A  Follow-Up:   See me in 6 months with EKG and labs    Other Special Instructions:   N/A    If you need a refill on your cardiac medications before your next appointment, please call your pharmacy.   Call the [place] office at [phone] if you have any questions, problems or concerns.

## 2016-07-18 LAB — BASIC METABOLIC PANEL
BUN: 13 mg/dL (ref 7–25)
CO2: 25 mmol/L (ref 20–31)
Calcium: 9.2 mg/dL (ref 8.6–10.3)
Chloride: 105 mmol/L (ref 98–110)
Creat: 1.01 mg/dL (ref 0.70–1.25)
Glucose, Bld: 130 mg/dL — ABNORMAL HIGH (ref 65–99)
Potassium: 4.1 mmol/L (ref 3.5–5.3)
Sodium: 140 mmol/L (ref 135–146)

## 2016-07-18 LAB — MAGNESIUM: Magnesium: 2 mg/dL (ref 1.5–2.5)

## 2016-09-06 ENCOUNTER — Other Ambulatory Visit: Payer: Self-pay | Admitting: Nurse Practitioner

## 2016-09-06 DIAGNOSIS — I4819 Other persistent atrial fibrillation: Secondary | ICD-10-CM

## 2016-09-18 ENCOUNTER — Telehealth: Payer: Self-pay | Admitting: Nurse Practitioner

## 2016-09-18 NOTE — Telephone Encounter (Signed)
EP issues are followed by Dr Rayann Heman.  Per 11/02/15 AFib clinic note the pt is following with them PRN. I will forward note to Dr Jackalyn Lombard team to address medication question.

## 2016-09-18 NOTE — Telephone Encounter (Signed)
Mr. Austin Park is calling because he states that his medication [place] Ginger) is no longer be covered by his insurance. The pharmacy is suggesting an alternative, (dofetilide) however Mr. Austin Park would like to know if it would be okay to switch. Please call, thanks.

## 2016-09-19 NOTE — Telephone Encounter (Signed)
Discussed with Dr Rayann Heman and he says it is ok to change to the generic Tikosyn. I have asked he let me know where he would like for the medication to be called in to and that he make an appointment with Dr Rayann Heman in the next 4-6 weeks for follow up after change.

## 2016-09-20 MED ORDER — DOFETILIDE 500 MCG PO CAPS: 500.0000 ug | ORAL_CAPSULE | Freq: Two times a day (BID) | ORAL | 3 refills | Status: DC

## 2016-09-20 NOTE — Telephone Encounter (Signed)
Generic sent into [facility]

## 2016-09-20 NOTE — Telephone Encounter (Signed)
Pt called to schedule 8 week EKG, and to let us know he wants his generic Tikosyn called into [facility]

## 2016-10-11 ENCOUNTER — Telehealth: Payer: Self-pay | Admitting: *Deleted

## 2016-10-11 MED ORDER — DOFETILIDE 500 MCG PO CAPS: 500.0000 ug | ORAL_CAPSULE | Freq: Two times a day (BID) | ORAL | 2 refills | Status: DC

## 2016-10-11 MED ORDER — DOFETILIDE 500 MCG PO CAPS: 500.0000 ug | ORAL_CAPSULE | Freq: Two times a day (BID) | ORAL | 0 refills | Status: DC

## 2016-10-11 NOTE — Telephone Encounter (Signed)
called to inform pt that his generic tikosyn was sent to [facility] but i would resend it to [facility] for him, he expressed understand.

## 2016-11-01 ENCOUNTER — Telehealth: Payer: Self-pay | Admitting: Internal Medicine

## 2016-11-01 NOTE — Telephone Encounter (Signed)
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 Rayann Heman stating that he is okay to drive . Would like for it to be faxed to [phone] Attn: [name] . Please call if you have any questions . Thanks

## 2016-11-03 NOTE — Telephone Encounter (Signed)
Discussed with Dr Rayann Heman and he would like for Dr Aundra Dubin to address

## 2016-11-14 ENCOUNTER — Ambulatory Visit (INDEPENDENT_AMBULATORY_CARE_PROVIDER_SITE_OTHER): Payer: 59

## 2016-11-14 VITALS — BP 120/78 | HR 70 | Wt 271.0 lb

## 2016-11-14 DIAGNOSIS — Z79899 Other long term (current) drug therapy: Secondary | ICD-10-CM

## 2016-11-14 DIAGNOSIS — I4891 Unspecified atrial fibrillation: Secondary | ICD-10-CM

## 2016-11-14 NOTE — Patient Instructions (Signed)
1.) Reason for visit: EKG for switching to generic tikosyn  2.) Name of MD requesting visit: Dr. Rayann Heman  3.) H&P: Patient taking tikosyn 500 mcg BID for Afib recently switched to generic.  4.) ROS related to problem: Patient denies any palpitations, chest pain, shortness of breath, or dizziness. Patient denies having to take extra metoprolol.  5.) Assessment and plan per MD: Reviewed by Dr. Rayann Heman. Continue current treatment.

## 2017-01-31 ENCOUNTER — Encounter: Payer: Self-pay | Admitting: Nurse Practitioner

## 2017-01-31 ENCOUNTER — Encounter (INDEPENDENT_AMBULATORY_CARE_PROVIDER_SITE_OTHER): Payer: Self-pay

## 2017-01-31 ENCOUNTER — Other Ambulatory Visit: Payer: Self-pay | Admitting: *Deleted

## 2017-01-31 ENCOUNTER — Ambulatory Visit (INDEPENDENT_AMBULATORY_CARE_PROVIDER_SITE_OTHER): Payer: 59 | Admitting: Nurse Practitioner

## 2017-01-31 VITALS — BP 138/78 | HR 69 | Ht 75.0 in | Wt 271.8 lb

## 2017-01-31 DIAGNOSIS — Z9989 Dependence on other enabling machines and devices: Secondary | ICD-10-CM

## 2017-01-31 DIAGNOSIS — G4733 Obstructive sleep apnea (adult) (pediatric): Secondary | ICD-10-CM | POA: Diagnosis not present

## 2017-01-31 DIAGNOSIS — I10 Essential (primary) hypertension: Secondary | ICD-10-CM

## 2017-01-31 DIAGNOSIS — I480 Paroxysmal atrial fibrillation: Secondary | ICD-10-CM

## 2017-01-31 DIAGNOSIS — Z79899 Other long term (current) drug therapy: Secondary | ICD-10-CM | POA: Diagnosis not present

## 2017-01-31 DIAGNOSIS — I4819 Other persistent atrial fibrillation: Secondary | ICD-10-CM

## 2017-01-31 DIAGNOSIS — I481 Persistent atrial fibrillation: Secondary | ICD-10-CM

## 2017-01-31 MED ORDER — LOSARTAN POTASSIUM 50 MG PO TABS: 25.0000 mg | ORAL_TABLET | Freq: Every day | ORAL | 3 refills | Status: DC

## 2017-01-31 MED ORDER — APIXABAN 5 MG PO TABS: 5.0000 mg | ORAL_TABLET | Freq: Two times a day (BID) | ORAL | 3 refills | Status: AC

## 2017-01-31 MED ORDER — DOFETILIDE 500 MCG PO CAPS: 500.0000 ug | ORAL_CAPSULE | Freq: Two times a day (BID) | ORAL | 3 refills | Status: DC

## 2017-01-31 MED ORDER — METOPROLOL SUCCINATE ER 50 MG PO TB24: ORAL_TABLET | ORAL | 3 refills | Status: DC

## 2017-01-31 NOTE — Patient Instructions (Addendum)
We will be checking the following labs today - BMET and MG   Medication Instructions:    Continue with your current medicines.   I have sent in your refills today    Testing/Procedures To Be Arranged:  N/A  Follow-Up:   I will see you back in 6 months if you are still in [place] and otherwise as needed.     Other Special Instructions:   N/A    If you need a refill on your cardiac medications before your next appointment, please call your pharmacy.   Call the [place] office at [phone] if you have any questions, problems or concerns.

## 2017-01-31 NOTE — Progress Notes (Signed)
CARDIOLOGY OFFICE NOTE  Date:  01/31/2017    Austin Park Date of Birth: [birth year] Medical Record #[id number]  PCP:  Seward Carol, MD  Cardiologist:  Annabell Howells   Chief Complaint  Patient presents with  . Atrial Fibrillation    Follow up visit - seen for Dr. Aundra Dubin    History of Present Illness: Austin Park is a 66 y.o. male who presents today for a follow up visit. Seen for Dr. Aundra Dubin.   He had a ruptured appendix in 10/11. Post-appendectomy, he developed atrial fibrillation. This was somewhat difficult to control and did not spontaneously convert, so TEE-guided cardioversion was done. He was on coumadin for about 6 wks after cardioversion. Coumadin was then stopped and he was put on ASA as his CHADSVASC score was only 1.  He developed recurrent atrial fibrillation with RVR in 11/16. DCCV was unsuccessful. He was put on flecainide and went back into NSR but atrial fibrillation recurred. Flecainide was increased to 75 mg bid and he had DCCV to NSR again in 12/16.However, atrial fibrillation recurred. He tolerated flecainide poorly due to dizziness and visual changes. He was seen by Dr Rayann Heman and it was decided that rather than atrial fibrillation ablation, we would admit him for dofetilide initiation. He was started on dofetilide and has been able to maintain NSR.   Last seen in March of 2017 by Dr. Aundra Dubin and was felt to be doing well. I then saw him back last November. Rare bouts of AF. Had a chest cold. Was planning on moving to [facility] and was going to establish care there.   Comes in today. Here alone. He is doing ok. Retiring next week. Moving to [place]. He feels good. Rare palpitation - will have some AF "from time to time" but nothing that bothers him. Tolerating his medicines. Feels like his Tikosyn "does wonders". No chest pain. Breathing is fine. No syncope. Needs his medicines refilled.   PMH: 1. Hypertension 2. Acute appendicitis s/p  appendectomy 10/11 (had rupture) 3. Atrial fibrillation: Post-op appendectdomy in 10/11, had TEE-guided DCCV. Recurrent in 11/16. Unsuccessful DCCV, started on flecainide. DCCV again in 12/16 with resumption of NSR but went back into atrial fibrillation. Now in NSR on dofetilide.  4. Diastolic CHF in the setting of atrial fibrillation with RVR. Echo (10/11): EF 60-65%, mild LVH, mild to moderate LAE. Echo (12/16) with EF 55%, mild RV dilation with normal systolic function.  5. OSA on CPAP 6. Hyperlipidemia 7. Cardiolite (12/16): Normal study.    Past Medical History:  Diagnosis Date  . Anxiety   . Basal cell carcinoma of skin   . Chronic diastolic CHF (congestive heart failure) ([place])   . HTN (hypertension)   . Hypercholesterolemia   . Insomnia   . Kidney stones   . Obstructive sleep apnea on CPAP    on CPAP  . Overweight   . Persistent atrial fibrillation ([place])   . Tobacco abuse QUIT 2014    Past Surgical History:  Procedure Laterality Date  . APPENDECTOMY    . CARDIOVERSION N/A 08/09/2015   Procedure: CARDIOVERSION;  Surgeon: [name], MD;  Location: [facility] ENDOSCOPY;  Service: Cardiovascular;  Laterality: N/A;  . CARDIOVERSION N/A 09/09/2015   Procedure: CARDIOVERSION;  Surgeon: Larey Dresser, MD;  Location: [place];  Service: Cardiovascular;  Laterality: N/A;  . TONSILLECTOMY       Medications: Current Outpatient Prescriptions  Medication Sig Dispense Refill  . acetaminophen (TYLENOL) 325  MG tablet Take 325 mg by mouth every 6 (six) hours as needed for moderate pain.    Marland Kitchen apixaban (ELIQUIS) 5 MG TABS tablet Take 1 tablet (5 mg total) by mouth 2 (two) times daily. 180 tablet 3  . losartan (COZAAR) 50 MG tablet Take 0.5 tablets (25 mg total) by mouth daily. 45 tablet 3  . metoprolol succinate (TOPROL-XL) 50 MG 24 hr tablet TAKE 1 TABLET TWICE DAILY-OK TO USE EXTRA TABLET AS NEEDED FOR PALPITAIONS 135 tablet 3  . Multiple Vitamin (MULTI VITAMIN PO) Take 1  tablet by mouth daily.    Marland Kitchen dofetilide (TIKOSYN) 500 MCG capsule Take 1 capsule (500 mcg total) by mouth 2 (two) times daily. 180 capsule 3  . simvastatin (ZOCOR) 40 MG tablet Take 1 tablet (40 mg total) by mouth every evening. (Patient taking differently: Take 40 mg by mouth every morning. ) 30 tablet 3   No current facility-administered medications for this visit.     Allergies: No Known Allergies  Social History: The patient  reports that he quit smoking about 3 years ago. His smoking use included Cigarettes. He quit after 40.00 years of use. He has never used smokeless tobacco. He reports that he does not drink alcohol or use drugs.   Family History: The patient's family history includes Cancer in his father; Hypertension in his brother, brother, father, mother, and sister.   Review of Systems: Please see the history of present illness.   Otherwise, the review of systems is positive for none.   All other systems are reviewed and negative.   Physical Exam: VS:  BP 138/78 (BP Location: Left Arm, Patient Position: Sitting, Cuff Size: Normal)   Pulse 69   Ht 6\' 3"  (1.905 m)   Wt 271 lb 12.8 oz (123.3 kg)   BMI 33.97 kg/m  .  BMI Body mass index is 33.97 kg/m.  Wt Readings from Last 3 Encounters:  01/31/17 271 lb 12.8 oz (123.3 kg)  11/14/16 271 lb (122.9 kg)  07/17/16 261 lb 12.8 oz (118.8 kg)    General: Pleasant. He is obese. Ruddy face. Alert and in no acute distress.   HEENT: Normal.  Neck: Supple, no JVD, carotid bruits, or masses noted.  Cardiac: Regular rate and rhythm. No murmurs, rubs, or gallops. No edema.  Respiratory:  Lungs are clear to auscultation bilaterally with normal work of breathing.  GI: Soft and nontender.  MS: No deformity or atrophy. Gait and ROM intact.  Skin: Warm and dry. Color is normal.  Neuro:  Strength and sensation are intact and no gross focal deficits noted.  Psych: Alert, appropriate and with normal affect.   LABORATORY DATA:  EKG:   EKG is ordered today. This demonstrates NSR.  Lab Results  Component Value Date   WBC 9.8 07/17/2016   HGB 16.0 07/17/2016   HCT 46.3 07/17/2016   PLT 202 07/17/2016   GLUCOSE 130 (H) 07/17/2016   CHOL 185 10/11/2010   TRIG 82.0 10/11/2010   HDL 50.30 10/11/2010   LDLDIRECT 191.3 07/14/2010   LDLCALC 118 (H) 10/11/2010   ALT 20 10/26/2015   AST 16 10/26/2015   NA 140 07/17/2016   K 4.1 07/17/2016   CL 105 07/17/2016   CREATININE 1.01 07/17/2016   BUN 13 07/17/2016   CO2 25 07/17/2016   INR 2.4 07/14/2010    BNP (last 3 results) No results for input(s): BNP in the last 8760 hours.  ProBNP (last 3 results) No results for input(s): PROBNP  in the last 8760 hours.   Other Studies Reviewed Today:  Myoview Study Highlights 08/2015    The study is normal.  This is a low risk study.  1. Low risk study 2. Nl perfusion. 3. Not gated secondary to AFIB    Echo Study Conclusions 08/2015  - Left ventricle: The cavity size was normal. Wall thickness was increased in a pattern of mild LVH. The estimated ejection fraction was 55%. Although no diagnostic regional wall motion abnormality was identified, this possibility cannot be completely excluded on the basis of this study. Indeterminant diastolic function (atrial fibrillation). - Aortic valve: There was no stenosis. - Mitral valve: There was no significant regurgitation. - Left atrium: The atrium was mildly dilated. - Right ventricle: The cavity size was mildly dilated. Systolic function was normal. - Right atrium: The atrium was mildly dilated. - Tricuspid valve: There was mild-moderate regurgitation. Peak RV-RA gradient (S): 26 mm Hg. - Pulmonary arteries: PA peak pressure: 29 mm Hg (S). - Inferior vena cava: The vessel was normal in size. The respirophasic diameter changes were in the normal range (>= 50%), consistent with normal central venous pressure.  Impressions:  - The patient was  in atrial fibrillation. Normal LV size with EF 55%. Mildly dilated RV with normal systolic function. Biatrial enlargement.   Assessment/Plan:  1. PAF - on Tikosyn. Remains in NSR by EKG and exam today - needs lab today. Doing well clinically. He will be transitioning his care to [place] later this year. Will be available as needed.   2. Chronic anticoagulation - on Eliquis - no problems noted. CBC today.   3. OSA - on CPAP  4. HTN - BP ok  5. Obesity - needs to work on risk factors - discussed at length - hopefully with his retirement he will be able to get motivated to make changes.   Current medicines are reviewed with the patient today.  The patient does not have concerns regarding medicines other than what has been noted above.  The following changes have been made:  See above.  Labs/ tests ordered today include:    Orders Placed This Encounter  Procedures  . Basic metabolic panel  . Magnesium  . CBC  . EKG 12-Lead     Disposition:   FU with with [place] in 6 months if he is here and otherwise prn.   Patient is agreeable to this plan and will call if any problems develop in the interim.   SignedTruitt Merle, NP  01/31/2017 11:18 AM  [facility] [address] [place], [place]  [address] Phone: [phone] Fax: [phone]

## 2017-02-01 LAB — BASIC METABOLIC PANEL
BUN/Creatinine Ratio: 16 (ref 10–24)
BUN: 17 mg/dL (ref 8–27)
CO2: 21 mmol/L (ref 18–29)
Calcium: 9 mg/dL (ref 8.6–10.2)
Chloride: 104 mmol/L (ref 96–106)
Creatinine, Ser: 1.05 mg/dL (ref 0.76–1.27)
GFR calc Af Amer: 86 mL/min/{1.73_m2} (ref >59)
GFR calc non Af Amer: 75 mL/min/{1.73_m2} (ref >59)
Glucose: 96 mg/dL (ref 65–99)
Potassium: 4.4 mmol/L (ref 3.5–5.2)
Sodium: 142 mmol/L (ref 134–144)

## 2017-02-01 LAB — CBC
Hematocrit: 46.3 % (ref 37.5–51.0)
Hemoglobin: 15.7 g/dL (ref 13.0–17.7)
MCH: 29.8 pg (ref 26.6–33.0)
MCHC: 33.9 g/dL (ref 31.5–35.7)
MCV: 88 fL (ref 79–97)
Platelets: 198 10*3/uL (ref 150–379)
RBC: 5.27 x10E6/uL (ref 4.14–5.80)
RDW: 14.6 % (ref 12.3–15.4)
WBC: 11.3 10*3/uL — ABNORMAL HIGH (ref 3.4–10.8)

## 2017-02-01 LAB — MAGNESIUM: Magnesium: 2.3 mg/dL (ref 1.6–2.3)

## 2017-03-04 IMAGING — NM NM MISC PROCEDURE
4 series · 24 of 24 positions shown · non-contrast
Comparison: none

[Series 1: wbr rest · 6.40mm/px · 6 of 64 frames shown]
[frame 6/64]
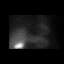
[frame 16/64]
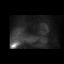
[frame 27/64]
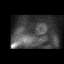
[frame 38/64]
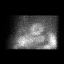
[frame 48/64]
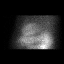
[frame 59/64]
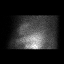

[Series 1: wbr_r-proj_st wbr rest · 6.40mm/px · 6 of 64 frames shown]
[frame 6/64]
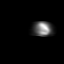
[frame 16/64]
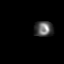
[frame 27/64]
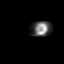
[frame 38/64]
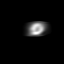
[frame 48/64]
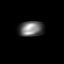
[frame 59/64]
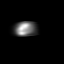

[Series 2: wbr_s-proj_st wbr stress ng · 6.40mm/px · 6 of 64 frames shown]
[frame 6/64]
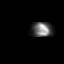
[frame 16/64]
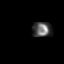
[frame 27/64]
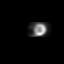
[frame 38/64]
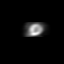
[frame 48/64]
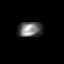
[frame 59/64]
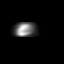

[Series 2: wbr stress ng · 6.40mm/px · 6 of 64 frames shown]
[frame 6/64]
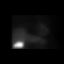
[frame 16/64]
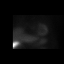
[frame 27/64]
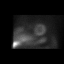
[frame 38/64]
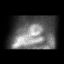
[frame 48/64]
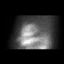
[frame 59/64]
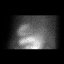

[24 of 24 positions shown; findings below may reference images not displayed]

Canned report from images found in remote index.

Refer to host system for actual result text.

## 2017-04-09 ENCOUNTER — Other Ambulatory Visit: Payer: Self-pay | Admitting: Cardiology

## 2017-04-10 NOTE — Telephone Encounter (Signed)
FOLLOWED BY Truitt Merle, NP

## 2017-04-17 ENCOUNTER — Other Ambulatory Visit (HOSPITAL_COMMUNITY): Payer: Self-pay | Admitting: Cardiology

## 2017-04-17 DIAGNOSIS — I4819 Other persistent atrial fibrillation: Secondary | ICD-10-CM

## 2017-04-17 MED ORDER — LOSARTAN POTASSIUM 50 MG PO TABS: 25.0000 mg | ORAL_TABLET | Freq: Every day | ORAL | 3 refills | Status: AC

## 2017-04-17 NOTE — Telephone Encounter (Signed)
Per fax from pharmacy Patient does not want mail order. Request Rx as mail order pharmacy would not transfer rx.

## 2017-07-18 ENCOUNTER — Other Ambulatory Visit: Payer: Self-pay | Admitting: Nurse Practitioner

## 2017-07-30 ENCOUNTER — Other Ambulatory Visit: Payer: Self-pay | Admitting: Nurse Practitioner

## 2017-07-30 DIAGNOSIS — I4819 Other persistent atrial fibrillation: Secondary | ICD-10-CM

## 2017-09-25 DIAGNOSIS — I4891 Unspecified atrial fibrillation: Secondary | ICD-10-CM | POA: Diagnosis not present

## 2017-09-25 DIAGNOSIS — E7800 Pure hypercholesterolemia, unspecified: Secondary | ICD-10-CM | POA: Diagnosis not present

## 2017-09-25 DIAGNOSIS — I10 Essential (primary) hypertension: Secondary | ICD-10-CM | POA: Diagnosis not present

## 2017-09-25 DIAGNOSIS — G4730 Sleep apnea, unspecified: Secondary | ICD-10-CM | POA: Diagnosis not present

## 2017-09-25 DIAGNOSIS — Z23 Encounter for immunization: Secondary | ICD-10-CM | POA: Diagnosis not present

## 2017-09-25 DIAGNOSIS — R7301 Impaired fasting glucose: Secondary | ICD-10-CM | POA: Diagnosis not present

## 2017-09-25 DIAGNOSIS — E669 Obesity, unspecified: Secondary | ICD-10-CM | POA: Diagnosis not present

## 2017-10-10 DIAGNOSIS — R7301 Impaired fasting glucose: Secondary | ICD-10-CM | POA: Diagnosis not present

## 2017-10-10 DIAGNOSIS — I4891 Unspecified atrial fibrillation: Secondary | ICD-10-CM | POA: Diagnosis not present

## 2017-10-10 DIAGNOSIS — E7800 Pure hypercholesterolemia, unspecified: Secondary | ICD-10-CM | POA: Diagnosis not present

## 2017-10-15 DIAGNOSIS — R7301 Impaired fasting glucose: Secondary | ICD-10-CM | POA: Diagnosis not present

## 2017-10-15 DIAGNOSIS — Z125 Encounter for screening for malignant neoplasm of prostate: Secondary | ICD-10-CM | POA: Diagnosis not present

## 2017-10-15 DIAGNOSIS — E669 Obesity, unspecified: Secondary | ICD-10-CM | POA: Diagnosis not present

## 2017-10-15 DIAGNOSIS — I4891 Unspecified atrial fibrillation: Secondary | ICD-10-CM | POA: Diagnosis not present

## 2017-10-15 DIAGNOSIS — E7800 Pure hypercholesterolemia, unspecified: Secondary | ICD-10-CM | POA: Diagnosis not present

## 2017-10-15 DIAGNOSIS — I10 Essential (primary) hypertension: Secondary | ICD-10-CM | POA: Diagnosis not present

## 2017-10-15 DIAGNOSIS — G4730 Sleep apnea, unspecified: Secondary | ICD-10-CM | POA: Diagnosis not present

## 2017-10-15 DIAGNOSIS — Z23 Encounter for immunization: Secondary | ICD-10-CM | POA: Diagnosis not present

## 2017-10-15 DIAGNOSIS — Z1389 Encounter for screening for other disorder: Secondary | ICD-10-CM | POA: Diagnosis not present

## 2017-10-15 DIAGNOSIS — Z0000 Encounter for general adult medical examination without abnormal findings: Secondary | ICD-10-CM | POA: Diagnosis not present

## 2017-10-30 DIAGNOSIS — N201 Calculus of ureter: Secondary | ICD-10-CM | POA: Diagnosis not present

## 2018-03-13 ENCOUNTER — Other Ambulatory Visit: Payer: Self-pay

## 2018-03-13 ENCOUNTER — Encounter: Payer: Self-pay | Admitting: Internal Medicine

## 2018-03-13 ENCOUNTER — Telehealth (HOSPITAL_COMMUNITY): Payer: Self-pay | Admitting: *Deleted

## 2018-03-13 DIAGNOSIS — I4819 Other persistent atrial fibrillation: Secondary | ICD-10-CM

## 2018-03-13 MED ORDER — METOPROLOL SUCCINATE ER 50 MG PO TB24: ORAL_TABLET | ORAL | 3 refills | Status: DC

## 2018-03-13 MED ORDER — METOPROLOL SUCCINATE ER 50 MG PO TB24: ORAL_TABLET | ORAL | 3 refills | Status: AC

## 2018-03-13 NOTE — Telephone Encounter (Signed)
Patient transferred from [facility] to our triage line asking for refills for his Metoprolol, last filled by Truitt Merle, NP.  Will route back to [facility] to fill medication.

## 2018-03-13 NOTE — Addendum Note (Signed)
Addended by: Willeen Cass A on: 03/13/2018 02:23 PM   Modules accepted: Orders

## 2018-04-30 ENCOUNTER — Encounter: Payer: Self-pay | Admitting: Internal Medicine

## 2018-05-21 DIAGNOSIS — L821 Other seborrheic keratosis: Secondary | ICD-10-CM | POA: Diagnosis not present

## 2018-05-21 DIAGNOSIS — Z23 Encounter for immunization: Secondary | ICD-10-CM | POA: Diagnosis not present

## 2018-05-21 DIAGNOSIS — D485 Neoplasm of uncertain behavior of skin: Secondary | ICD-10-CM | POA: Diagnosis not present

## 2018-05-21 DIAGNOSIS — Z85828 Personal history of other malignant neoplasm of skin: Secondary | ICD-10-CM | POA: Diagnosis not present

## 2018-05-22 ENCOUNTER — Ambulatory Visit (INDEPENDENT_AMBULATORY_CARE_PROVIDER_SITE_OTHER): Payer: Medicare Other | Admitting: Internal Medicine

## 2018-05-22 ENCOUNTER — Encounter: Payer: Self-pay | Admitting: Internal Medicine

## 2018-05-22 VITALS — BP 122/78 | HR 75 | Ht 75.0 in | Wt 276.0 lb

## 2018-05-22 DIAGNOSIS — Z9989 Dependence on other enabling machines and devices: Secondary | ICD-10-CM | POA: Diagnosis not present

## 2018-05-22 DIAGNOSIS — I10 Essential (primary) hypertension: Secondary | ICD-10-CM | POA: Diagnosis not present

## 2018-05-22 DIAGNOSIS — I481 Persistent atrial fibrillation: Secondary | ICD-10-CM | POA: Diagnosis not present

## 2018-05-22 DIAGNOSIS — G4733 Obstructive sleep apnea (adult) (pediatric): Secondary | ICD-10-CM | POA: Diagnosis not present

## 2018-05-22 DIAGNOSIS — Z79899 Other long term (current) drug therapy: Secondary | ICD-10-CM | POA: Diagnosis not present

## 2018-05-22 DIAGNOSIS — I4819 Other persistent atrial fibrillation: Secondary | ICD-10-CM

## 2018-05-22 LAB — BASIC METABOLIC PANEL
BUN/Creatinine Ratio: 17 (ref 10–24)
BUN: 20 mg/dL (ref 8–27)
CALCIUM: 9.3 mg/dL (ref 8.6–10.2)
CO2: 22 mmol/L (ref 20–29)
Chloride: 101 mmol/L (ref 96–106)
Creatinine, Ser: 1.18 mg/dL (ref 0.76–1.27)
GFR calc non Af Amer: 64 mL/min/{1.73_m2} (ref >59)
GFR, EST AFRICAN AMERICAN: 74 mL/min/{1.73_m2} (ref >59)
Glucose: 145 mg/dL — ABNORMAL HIGH (ref 65–99)
Potassium: 4.2 mmol/L (ref 3.5–5.2)
Sodium: 140 mmol/L (ref 134–144)

## 2018-05-22 LAB — MAGNESIUM: MAGNESIUM: 2.1 mg/dL (ref 1.6–2.3)

## 2018-05-22 NOTE — Patient Instructions (Signed)
Medication Instructions:  Your physician recommends that you continue on your current medications as directed. Please refer to the Current Medication list given to you today.  * If you need a refill on your cardiac medications before your next appointment, please call your pharmacy.   Labwork: Today: BMET & Magnesium *We will only notify you of abnormal results, otherwise continue current treatment plan.  Testing/Procedures: None ordered  Follow-Up: Your physician wants you to follow-up in: 6 months with Truitt Merle, NP.  You will receive a reminder letter in the mail two months in advance. If you don't receive a letter, please call our office to schedule the follow-up appointment.  *Please note that any paperwork needing to be filled out by the provider will need to be addressed at the front desk prior to seeing the provider. Please note that any FMLA, disability or other documents regarding health condition is subject to a $25.00 charge that must be received prior to completion of paperwork in the form of a money order or check.  Thank you for choosing [facility]!!

## 2018-05-22 NOTE — Progress Notes (Signed)
PCP: Seward Carol, MD Primary Cardiologist: Dr Aundra Dubin Primary EP: Dr Consuella Lose Warth is a 67 y.o. male who presents today for routine electrophysiology followup.  Since last being seen in our clinic, the patient reports doing very well.  Today, he denies symptoms of palpitations, chest pain, shortness of breath,  lower extremity edema, dizziness, presyncope, or syncope.  Rare afib episodes.  He lives in [place] but continues to come her for healthcare.  He still owns his home in [place] which he is remodeling in order to sell.  The patient is otherwise without complaint today.   Past Medical History:  Diagnosis Date  . Anxiety   . Basal cell carcinoma of skin   . Chronic diastolic CHF (congestive heart failure) ([place])   . HTN (hypertension)   . Hypercholesterolemia   . Insomnia   . Kidney stones   . Obstructive sleep apnea on CPAP    on CPAP  . Overweight   . Persistent atrial fibrillation ([place])   . Tobacco abuse QUIT 2014   Past Surgical History:  Procedure Laterality Date  . APPENDECTOMY    . CARDIOVERSION N/A 08/09/2015   Procedure: CARDIOVERSION;  Surgeon: [name], MD;  Location: [facility] ENDOSCOPY;  Service: Cardiovascular;  Laterality: N/A;  . CARDIOVERSION N/A 09/09/2015   Procedure: CARDIOVERSION;  Surgeon: Larey Dresser, MD;  Location: [facility] ENDOSCOPY;  Service: Cardiovascular;  Laterality: N/A;  . TONSILLECTOMY      ROS- all systems are reviewed and negatives except as per HPI above  Current Outpatient Medications  Medication Sig Dispense Refill  . acetaminophen (TYLENOL) 325 MG tablet Take 325 mg by mouth every 6 (six) hours as needed for moderate pain.    Marland Kitchen apixaban (ELIQUIS) 5 MG TABS tablet Take 1 tablet (5 mg total) by mouth 2 (two) times daily. 180 tablet 3  . dofetilide (TIKOSYN) 500 MCG capsule TAKE 1 CAPSULE TWICE A DAY 180 capsule 2  . losartan (COZAAR) 50 MG tablet Take 0.5 tablets (25 mg total) by mouth daily. 45 tablet 3  .  metoprolol succinate (TOPROL-XL) 50 MG 24 hr tablet TAKE 1 TABLET TWICE A DAY (OK TO USE EXTRA TABLET AS NEEDED FOR PALPITATIONS) 270 tablet 3  . Multiple Vitamin (MULTI VITAMIN PO) Take 1 tablet by mouth daily.    . simvastatin (ZOCOR) 40 MG tablet Take 40 mg by mouth daily.     No current facility-administered medications for this visit.     Physical Exam: Vitals:   05/22/18 1020  BP: 122/78  Pulse: 75  SpO2: 97%  Weight: 276 lb (125.2 kg)  Height: 6\' 3"  (1.905 m)    GEN- The patient is well appearing, alert and oriented x 3 today.   Head- normocephalic, atraumatic Eyes-  Sclera clear, conjunctiva pink Ears- hearing intact Oropharynx- clear Lungs- Clear to ausculation bilaterally, normal work of breathing Heart- Regular rate and rhythm, no murmurs, rubs or gallops, PMI not laterally displaced GI- soft, NT, ND, + BS Extremities- no clubbing, cyanosis, or edema  Wt Readings from Last 3 Encounters:  05/22/18 276 lb (125.2 kg)  01/31/17 271 lb 12.8 oz (123.3 kg)  11/14/16 271 lb (122.9 kg)    EKG tracing ordered today is personally reviewed and shows sinus rhythm 75 bpm, PR 170 msec, QRS 82 msec, Qtc 452 msec  Assessment and Plan:  1. Persistent afib Maintaining sinus rhythm with tikosyn Qt is stable Bmet, mg today  chads2vascs core is 2.  Continue  eliquis  2.  OSA Uses CPAP regularly  3. HTN Stable No change required today  4. Overweight Body mass index is 34.5 kg/m. Lifestyle modification encouraged   5. HL Lipids followed by Dr Melford Aase Check lfts today (not fasting) Will obtain last lipids from Dr Pollite's office  Follow-up in 6 months with Nicoletta Ba MD, [facility] 05/22/2018 10:38 AM

## 2018-05-31 DIAGNOSIS — L820 Inflamed seborrheic keratosis: Secondary | ICD-10-CM | POA: Diagnosis not present

## 2018-05-31 DIAGNOSIS — D0472 Carcinoma in situ of skin of left lower limb, including hip: Secondary | ICD-10-CM | POA: Diagnosis not present

## 2018-05-31 DIAGNOSIS — C44719 Basal cell carcinoma of skin of left lower limb, including hip: Secondary | ICD-10-CM | POA: Diagnosis not present

## 2018-05-31 DIAGNOSIS — Z85828 Personal history of other malignant neoplasm of skin: Secondary | ICD-10-CM | POA: Diagnosis not present

## 2018-05-31 DIAGNOSIS — D485 Neoplasm of uncertain behavior of skin: Secondary | ICD-10-CM | POA: Diagnosis not present

## 2018-08-06 DIAGNOSIS — H47333 Pseudopapilledema of optic disc, bilateral: Secondary | ICD-10-CM | POA: Diagnosis not present

## 2018-08-06 DIAGNOSIS — H2513 Age-related nuclear cataract, bilateral: Secondary | ICD-10-CM | POA: Diagnosis not present

## 2018-08-19 DIAGNOSIS — H2513 Age-related nuclear cataract, bilateral: Secondary | ICD-10-CM | POA: Diagnosis not present

## 2018-09-16 DIAGNOSIS — H2511 Age-related nuclear cataract, right eye: Secondary | ICD-10-CM | POA: Diagnosis not present

## 2018-09-16 DIAGNOSIS — E0590 Thyrotoxicosis, unspecified without thyrotoxic crisis or storm: Secondary | ICD-10-CM | POA: Diagnosis not present

## 2018-09-16 DIAGNOSIS — I4891 Unspecified atrial fibrillation: Secondary | ICD-10-CM | POA: Diagnosis not present

## 2018-09-16 DIAGNOSIS — G4730 Sleep apnea, unspecified: Secondary | ICD-10-CM | POA: Diagnosis not present

## 2018-09-16 DIAGNOSIS — Z87891 Personal history of nicotine dependence: Secondary | ICD-10-CM | POA: Diagnosis not present

## 2018-09-16 DIAGNOSIS — I10 Essential (primary) hypertension: Secondary | ICD-10-CM | POA: Diagnosis not present

## 2018-09-25 DIAGNOSIS — G4733 Obstructive sleep apnea (adult) (pediatric): Secondary | ICD-10-CM | POA: Diagnosis not present

## 2018-09-25 DIAGNOSIS — Z87442 Personal history of urinary calculi: Secondary | ICD-10-CM | POA: Diagnosis not present

## 2018-09-25 DIAGNOSIS — E782 Mixed hyperlipidemia: Secondary | ICD-10-CM | POA: Diagnosis not present

## 2018-09-25 DIAGNOSIS — Z79899 Other long term (current) drug therapy: Secondary | ICD-10-CM | POA: Diagnosis not present

## 2018-09-25 DIAGNOSIS — I480 Paroxysmal atrial fibrillation: Secondary | ICD-10-CM | POA: Diagnosis not present

## 2018-09-25 DIAGNOSIS — I10 Essential (primary) hypertension: Secondary | ICD-10-CM | POA: Diagnosis not present

## 2018-10-02 DIAGNOSIS — E782 Mixed hyperlipidemia: Secondary | ICD-10-CM | POA: Diagnosis not present

## 2018-10-02 DIAGNOSIS — I10 Essential (primary) hypertension: Secondary | ICD-10-CM | POA: Diagnosis not present

## 2018-10-17 DIAGNOSIS — C44629 Squamous cell carcinoma of skin of left upper limb, including shoulder: Secondary | ICD-10-CM | POA: Diagnosis not present

## 2018-10-17 DIAGNOSIS — Z719 Counseling, unspecified: Secondary | ICD-10-CM | POA: Diagnosis not present

## 2018-10-17 DIAGNOSIS — D485 Neoplasm of uncertain behavior of skin: Secondary | ICD-10-CM | POA: Diagnosis not present

## 2018-10-21 DIAGNOSIS — N200 Calculus of kidney: Secondary | ICD-10-CM | POA: Diagnosis not present

## 2018-10-24 DIAGNOSIS — H2512 Age-related nuclear cataract, left eye: Secondary | ICD-10-CM | POA: Diagnosis not present

## 2018-10-24 DIAGNOSIS — H47333 Pseudopapilledema of optic disc, bilateral: Secondary | ICD-10-CM | POA: Diagnosis not present

## 2018-10-24 DIAGNOSIS — Z9841 Cataract extraction status, right eye: Secondary | ICD-10-CM | POA: Diagnosis not present

## 2018-10-24 DIAGNOSIS — Z961 Presence of intraocular lens: Secondary | ICD-10-CM | POA: Diagnosis not present

## 2018-10-28 DIAGNOSIS — G4733 Obstructive sleep apnea (adult) (pediatric): Secondary | ICD-10-CM | POA: Diagnosis not present

## 2018-11-07 DIAGNOSIS — I10 Essential (primary) hypertension: Secondary | ICD-10-CM | POA: Diagnosis not present

## 2018-11-07 DIAGNOSIS — Z7901 Long term (current) use of anticoagulants: Secondary | ICD-10-CM | POA: Diagnosis not present

## 2018-11-07 DIAGNOSIS — Z6834 Body mass index (BMI) 34.0-34.9, adult: Secondary | ICD-10-CM | POA: Diagnosis not present

## 2018-11-07 DIAGNOSIS — I4819 Other persistent atrial fibrillation: Secondary | ICD-10-CM | POA: Diagnosis not present

## 2018-11-07 DIAGNOSIS — G4733 Obstructive sleep apnea (adult) (pediatric): Secondary | ICD-10-CM | POA: Diagnosis not present

## 2018-11-07 DIAGNOSIS — E669 Obesity, unspecified: Secondary | ICD-10-CM | POA: Diagnosis not present

## 2018-11-07 DIAGNOSIS — E785 Hyperlipidemia, unspecified: Secondary | ICD-10-CM | POA: Diagnosis not present

## 2018-11-13 DIAGNOSIS — N200 Calculus of kidney: Secondary | ICD-10-CM | POA: Diagnosis not present

## 2018-11-18 DIAGNOSIS — H52202 Unspecified astigmatism, left eye: Secondary | ICD-10-CM | POA: Diagnosis not present

## 2018-11-18 DIAGNOSIS — Z87891 Personal history of nicotine dependence: Secondary | ICD-10-CM | POA: Diagnosis not present

## 2018-11-18 DIAGNOSIS — H2512 Age-related nuclear cataract, left eye: Secondary | ICD-10-CM | POA: Diagnosis not present

## 2018-11-18 DIAGNOSIS — Z9989 Dependence on other enabling machines and devices: Secondary | ICD-10-CM | POA: Diagnosis not present

## 2018-11-18 DIAGNOSIS — I4819 Other persistent atrial fibrillation: Secondary | ICD-10-CM | POA: Diagnosis not present

## 2018-11-18 DIAGNOSIS — G4733 Obstructive sleep apnea (adult) (pediatric): Secondary | ICD-10-CM | POA: Diagnosis not present

## 2018-11-18 DIAGNOSIS — I10 Essential (primary) hypertension: Secondary | ICD-10-CM | POA: Diagnosis not present

## 2018-11-20 DIAGNOSIS — Z9989 Dependence on other enabling machines and devices: Secondary | ICD-10-CM | POA: Diagnosis not present

## 2018-11-20 DIAGNOSIS — I4819 Other persistent atrial fibrillation: Secondary | ICD-10-CM | POA: Diagnosis not present

## 2018-11-20 DIAGNOSIS — E782 Mixed hyperlipidemia: Secondary | ICD-10-CM | POA: Diagnosis not present

## 2018-11-20 DIAGNOSIS — I10 Essential (primary) hypertension: Secondary | ICD-10-CM | POA: Diagnosis not present

## 2018-11-20 DIAGNOSIS — G4733 Obstructive sleep apnea (adult) (pediatric): Secondary | ICD-10-CM | POA: Diagnosis not present

## 2018-12-10 DIAGNOSIS — I10 Essential (primary) hypertension: Secondary | ICD-10-CM | POA: Diagnosis not present

## 2018-12-10 DIAGNOSIS — I4819 Other persistent atrial fibrillation: Secondary | ICD-10-CM | POA: Diagnosis not present

## 2018-12-11 DIAGNOSIS — I359 Nonrheumatic aortic valve disorder, unspecified: Secondary | ICD-10-CM | POA: Diagnosis not present
